# Patient Record
Sex: Male | Born: 1970 | Race: White | Hispanic: No | Smoking: Never smoker
Health system: Southern US, Community
[De-identification: ages and names within clinical notes are randomized; demographics above are authoritative.]

## PROBLEM LIST (undated history)

## (undated) DIAGNOSIS — R42 Dizziness and giddiness: Secondary | ICD-10-CM

## (undated) HISTORY — DX: Dizziness and giddiness: R42

## (undated) HISTORY — PX: UMBILICAL HERNIA REPAIR: SHX196

---

## 2002-03-10 ENCOUNTER — Encounter: Payer: Self-pay | Admitting: Family Medicine

## 2002-03-10 ENCOUNTER — Ambulatory Visit (HOSPITAL_COMMUNITY): Admission: RE | Admit: 2002-03-10 | Discharge: 2002-03-10 | Payer: Self-pay | Admitting: Family Medicine

## 2002-05-11 ENCOUNTER — Emergency Department (HOSPITAL_COMMUNITY): Admission: EM | Admit: 2002-05-11 | Discharge: 2002-05-11 | Payer: Self-pay | Admitting: Emergency Medicine

## 2002-05-11 ENCOUNTER — Encounter: Payer: Self-pay | Admitting: Emergency Medicine

## 2003-02-07 ENCOUNTER — Encounter: Payer: Self-pay | Admitting: Orthopedic Surgery

## 2003-02-07 ENCOUNTER — Encounter: Admission: RE | Admit: 2003-02-07 | Discharge: 2003-02-07 | Payer: Self-pay | Admitting: Gastroenterology

## 2004-11-20 ENCOUNTER — Emergency Department (HOSPITAL_COMMUNITY): Admission: EM | Admit: 2004-11-20 | Discharge: 2004-11-20 | Payer: Self-pay | Admitting: Emergency Medicine

## 2013-08-15 ENCOUNTER — Ambulatory Visit
Admission: RE | Admit: 2013-08-15 | Discharge: 2013-08-15 | Disposition: A | Discharge status: Home / Self Care | Payer: BC Managed Care – PPO | Source: Ambulatory Visit | Attending: Physician Assistant | Admitting: Physician Assistant

## 2013-08-15 ENCOUNTER — Other Ambulatory Visit: Payer: Self-pay | Admitting: Physician Assistant

## 2013-08-15 DIAGNOSIS — R079 Chest pain, unspecified: Secondary | ICD-10-CM

## 2014-01-01 ENCOUNTER — Other Ambulatory Visit: Payer: Self-pay | Admitting: Family Medicine

## 2014-01-01 ENCOUNTER — Ambulatory Visit
Admission: RE | Admit: 2014-01-01 | Discharge: 2014-01-01 | Disposition: A | Discharge status: Home / Self Care | Payer: BC Managed Care – PPO | Source: Ambulatory Visit | Attending: Family Medicine | Admitting: Family Medicine

## 2014-01-01 DIAGNOSIS — R52 Pain, unspecified: Secondary | ICD-10-CM

## 2014-01-16 ENCOUNTER — Other Ambulatory Visit: Payer: Self-pay | Admitting: Physician Assistant

## 2014-01-16 DIAGNOSIS — R51 Headache: Secondary | ICD-10-CM

## 2014-01-16 DIAGNOSIS — R42 Dizziness and giddiness: Secondary | ICD-10-CM

## 2014-01-20 ENCOUNTER — Ambulatory Visit
Admission: RE | Admit: 2014-01-20 | Discharge: 2014-01-20 | Disposition: A | Discharge status: Home / Self Care | Payer: BC Managed Care – PPO | Source: Ambulatory Visit | Attending: Physician Assistant | Admitting: Physician Assistant

## 2014-01-20 DIAGNOSIS — R51 Headache: Secondary | ICD-10-CM

## 2014-01-20 DIAGNOSIS — R42 Dizziness and giddiness: Secondary | ICD-10-CM

## 2014-02-21 ENCOUNTER — Encounter: Payer: Self-pay | Admitting: Neurology

## 2014-02-21 ENCOUNTER — Ambulatory Visit (INDEPENDENT_AMBULATORY_CARE_PROVIDER_SITE_OTHER): Payer: BC Managed Care – PPO | Admitting: Neurology

## 2014-02-21 VITALS — BP 130/68 | HR 78 | Temp 98.7°F | Resp 18 | Ht 68.0 in | Wt 144.6 lb

## 2014-02-21 DIAGNOSIS — M542 Cervicalgia: Secondary | ICD-10-CM

## 2014-02-21 DIAGNOSIS — H9311 Tinnitus, right ear: Secondary | ICD-10-CM

## 2014-02-21 DIAGNOSIS — R209 Unspecified disturbances of skin sensation: Secondary | ICD-10-CM

## 2014-02-21 DIAGNOSIS — R200 Anesthesia of skin: Secondary | ICD-10-CM

## 2014-02-21 DIAGNOSIS — H9319 Tinnitus, unspecified ear: Secondary | ICD-10-CM

## 2014-02-21 DIAGNOSIS — R42 Dizziness and giddiness: Secondary | ICD-10-CM

## 2014-02-21 NOTE — Progress Notes (Signed)
NEUROLOGY CONSULTATION NOTE  Jeremiah Bailey MRN: [id number] DOB: 10-Jul-1970  Referring provider: Dr. Blair Heys Primary care provider: Dr. Blair Heys  Reason for consult:  Recurrent episodes of dizziness, off-balance  Dear Dr Manus Gunning:  Thank you for your kind referral of Jeremiah Bailey for consultation of the above symptoms. Although his history is well known to you, please allow me to reiterate it for the purpose of our medical record. Records and images were personally reviewed where available.  HISTORY OF PRESENT ILLNESS: This is a pleasant 43 year old right-handed man with no significant past medical history, in his usual state of health until the first week of July while at work when he had sudden onset dizziness described as lightheadedness and feeling "swimmy-headed," off-balance, feeling like he would pass out.  The symptoms eased off in 5-10 minutes, however he continued to feel "off and swimmy-headed" and went home.  Symptoms resolved that evening.  There was no associated headache but he did feel a pressure in his head.  No nausea, vomiting, focal numbness/tingling/weakness, or confusion.  He went to his PCP and had reported neck pain radiating up his head. A cervical xray done had shown degenerative disc disease at C4-5, C5-6, and C6-7 levels with loss of disc space, sclerosis, and spurring.  He was given Etodolac prn.  Around 2 weeks later, he had the same symptoms at work with sudden onset lightheadedness and sensation that he would pass out.  Since then, he has had a constant sensation of imbalance, and would have recurrent brief episodes of feeling lightheaded of varying intensity, lasting 5-10 minutes, occurring a couple of times a week.  It can occur while standing or sitting, one time it occurred while lying down. He cannot pinpoint any specific triggers. Over the past 1-2 months, he has had an increase in headaches over the frontal regions, occurring 1-2 times a week  with a dull aching pressure in the afternoon, usually resolved when he wakes up the next morning. There is no associated nausea/vomiting, he has been more photosensitive. He takes prn Advil. He occasionally hears his heart beat on the right temporal region and endorses occasional bilateral tinnitus. He feels he had hearing loss and saw an ENT specialist 2 weeks ago but was told hearing test only showed mild hearing loss with no other problems.  He occasionally feels a pressure around his ears. He has occasional sensation of electricity in the right frontal region radiating into his right eye.  He continues to have neck pain and cannot find a comfortable position. He has tightness radiating down the shoulder blades when he flexes his head down. He has occasional tingling in his hands and fingers lasting 30-60 minutes 1-2 times a week.  He has noted weakness in his legs, described as muscle fatigue as if he had jogged a long distance.  He has a prescription for Xanax due to anxiety for the past few weeks due to lack of work and his current symptoms.  He denies any episodes of staring/unresponsiveness, gaps in time, olfactory/gustatory hallucinations, deja vu, rising epigastric sensation, focal numbness/tingling/weakness, myoclonic jerks. His mother had bouts of dizziness of unclear etiology per patient. He denies any recent head injuries. He was thrown off a horse in the late 1990s with brief loss of consciousness. Otherwise he had a normal birth and early development.  There is no history of febrile convulsions, CNS infections such as meningitis/encephalitis, neurosurgical procedures, or family history of seizures.  I personally  reviewed head CT without contrast which was normal.   Most recent bloodwork available from 03/2013 showed normal CBC, CMP, TSH, lipid panel.  PAST MEDICAL HISTORY: Past Medical History  Diagnosis Date  . Dizziness and giddiness     PAST SURGICAL HISTORY: Past Surgical History    Procedure Laterality Date  . Umbilical hernia repair      MEDICATIONS: No current outpatient prescriptions on file prior to visit.   No current facility-administered medications on file prior to visit.    ALLERGIES: Allergies  Allergen Reactions  . Prednisone   . Septra [Sulfamethoxazole-Trimethoprim]     FAMILY HISTORY: Family History  Problem Relation Age of Onset  . Hypertension Father   . Cancer Maternal Grandmother     ovarian   . Hypertension Maternal Grandfather   . Stroke Maternal Grandfather   . Heart failure Paternal Grandmother   . Cancer Paternal Grandfather     unknown    SOCIAL HISTORY: History   Social History  . Marital Status: Divorced    Spouse Name: N/A    Number of Children: N/A  . Years of Education: N/A   Occupational History  . Not on file.   Social History Main Topics  . Smoking status: Never Smoker   . Smokeless tobacco: Not on file  . Alcohol Use: Yes  . Drug Use: No  . Sexual Activity: Yes    Partners: Female    Pharmacist, hospital Protection: None   Other Topics Concern  . Not on file   Social History Narrative  . No narrative on file    REVIEW OF SYSTEMS: Constitutional: No fevers, chills, or sweats, no generalized fatigue, change in appetite Eyes: No visual changes, double vision, eye pain Ear, nose and throat: + hearing loss, no ear pain, nasal congestion, sore throat Cardiovascular: No chest pain, palpitations Respiratory:  No shortness of breath at rest or with exertion, wheezes GastrointestinaI: No nausea, vomiting, diarrhea, abdominal pain, fecal incontinence Genitourinary:  No dysuria, urinary retention or frequency Musculoskeletal:  + neck pain, back pain Integumentary: No rash, pruritus, skin lesions Neurological: as above Psychiatric: No depression, insomnia, +anxiety Endocrine: No palpitations, fatigue, diaphoresis, mood swings, change in appetite, change in weight, increased thirst Hematologic/Lymphatic:  No  anemia, purpura, petechiae. Allergic/Immunologic: no itchy/runny eyes, nasal congestion, recent allergic reactions, rashes  PHYSICAL EXAM: Filed Vitals:   02/21/14 1317  BP: 130/68  Pulse: 78  Temp: 98.7 F (37.1 C)  Resp: 18   General: No acute distress Head:  Normocephalic/atraumatic Eyes: Fundoscopic exam shows bilateral sharp discs, no vessel changes, exudates, or hemorrhages Neck: supple, no paraspinal tenderness, full range of motion Back: No paraspinal tenderness Heart: regular rate and rhythm Lungs: Clear to auscultation bilaterally. Vascular: No carotid bruits. Skin/Extremities: No rash, no edema Neurological Exam: Mental status: alert and oriented to person, place, and time, no dysarthria or aphasia, Fund of knowledge is appropriate.  Recent and remote memory are intact.  Attention and concentration are normal.    Able to name objects and repeat phrases. Cranial nerves: CN I: not tested CN II: pupils equal, round and reactive to light, visual fields intact, fundi unremarkable. CN III, IV, VI:  full range of motion, no nystagmus, no ptosis CN V: decreased cold on left V2-3, intact to pin. Split midline with tuning fork and pin to right side CN VII: upper and lower face symmetric CN VIII: hearing intact to finger rub CN IX, X: gag intact, uvula midline CN XI: sternocleidomastoid and trapezius muscles  intact CN XII: tongue midline Bulk & Tone: normal, no fasciculations. Motor: 5/5 throughout with no pronator drift. Sensation: intact to light touch, cold, pin, vibration and joint position sense.  No extinction to double simultaneous stimulation.  Romberg test negative Deep Tendon Reflexes: +2 throughout, no ankle clonus Plantar responses: downgoing bilaterally Cerebellar: no incoordination on finger to nose, heel to shin. No dysdiadochokinesia Gait: narrow-based and steady, able to tandem walk adequately. Tremor: none  IMPRESSION: This is a pleasant 43 year old  right-handed man with no significant past medical history presenting for new onset recurrent episodes of dizziness of varying intensity, lasting a few minutes, but with sensation of feeling off-balance and increased headaches, as well as pulsing sensation on the right side of his head.  His neurological exam shows subjective decreased sensation on the right side of his face, otherwise non-focal. The etiology of his symptoms is unclear, differential diagnosis is broad, including headache-related dizziness, cervicogenic dizziness, seizures, demyelinating disease.  MRI brain with and without contrast, MRI C-spine without contrast, and routine EEG will be ordered to further evaluate his symptoms.  [place] driving laws were discussed with the patient, and he knows to stop driving after an episode of loss of consciousness, until 6 months event-free. He will follow-up after the tests.  Thank you for allowing me to participate in the care of this patient. Please do not hesitate to call for any questions or concerns.   Patrcia Dolly, M.D.  CC: Dr. Manus Gunning

## 2014-02-21 NOTE — Patient Instructions (Addendum)
1. MRI brain with and without contrast 2. MRI cervical spine without contrast 3. Routine EEG 4. Follow-up after tests

## 2014-02-22 ENCOUNTER — Encounter: Payer: Self-pay | Admitting: Neurology

## 2014-02-22 DIAGNOSIS — M542 Cervicalgia: Secondary | ICD-10-CM | POA: Insufficient documentation

## 2014-02-22 DIAGNOSIS — H93A9 Pulsatile tinnitus, unspecified ear: Secondary | ICD-10-CM | POA: Insufficient documentation

## 2014-02-22 DIAGNOSIS — R42 Dizziness and giddiness: Secondary | ICD-10-CM | POA: Insufficient documentation

## 2014-02-22 DIAGNOSIS — R200 Anesthesia of skin: Secondary | ICD-10-CM | POA: Insufficient documentation

## 2014-03-04 ENCOUNTER — Ambulatory Visit (INDEPENDENT_AMBULATORY_CARE_PROVIDER_SITE_OTHER): Payer: BC Managed Care – PPO | Admitting: Neurology

## 2014-03-04 DIAGNOSIS — R200 Anesthesia of skin: Secondary | ICD-10-CM

## 2014-03-04 DIAGNOSIS — R42 Dizziness and giddiness: Secondary | ICD-10-CM

## 2014-03-05 ENCOUNTER — Other Ambulatory Visit: Payer: BC Managed Care – PPO

## 2014-03-05 ENCOUNTER — Ambulatory Visit (HOSPITAL_COMMUNITY): Admission: RE | Admit: 2014-03-05 | Payer: BC Managed Care – PPO | Source: Ambulatory Visit

## 2014-03-05 ENCOUNTER — Ambulatory Visit (HOSPITAL_COMMUNITY)
Admission: RE | Admit: 2014-03-05 | Discharge: 2014-03-05 | Disposition: A | Discharge status: Home / Self Care | Payer: BC Managed Care – PPO | Source: Ambulatory Visit | Attending: Neurology | Admitting: Neurology

## 2014-03-05 DIAGNOSIS — J320 Chronic maxillary sinusitis: Secondary | ICD-10-CM | POA: Insufficient documentation

## 2014-03-05 DIAGNOSIS — M48061 Spinal stenosis, lumbar region without neurogenic claudication: Secondary | ICD-10-CM | POA: Insufficient documentation

## 2014-03-05 DIAGNOSIS — R51 Headache: Secondary | ICD-10-CM | POA: Insufficient documentation

## 2014-03-05 DIAGNOSIS — R42 Dizziness and giddiness: Secondary | ICD-10-CM | POA: Diagnosis not present

## 2014-03-05 DIAGNOSIS — M542 Cervicalgia: Secondary | ICD-10-CM

## 2014-03-05 DIAGNOSIS — M47812 Spondylosis without myelopathy or radiculopathy, cervical region: Secondary | ICD-10-CM | POA: Insufficient documentation

## 2014-03-05 MED ORDER — GADOBENATE DIMEGLUMINE 529 MG/ML IV SOLN
15.0000 mL | Freq: Once | INTRAVENOUS | Status: AC | PRN
  Administered 2014-03-05: 13 mL via INTRAVENOUS

## 2014-03-07 ENCOUNTER — Other Ambulatory Visit: Payer: Self-pay | Admitting: Family Medicine

## 2014-03-07 DIAGNOSIS — M542 Cervicalgia: Secondary | ICD-10-CM

## 2014-03-07 DIAGNOSIS — R42 Dizziness and giddiness: Secondary | ICD-10-CM

## 2014-03-10 NOTE — Procedures (Signed)
ELECTROENCEPHALOGRAM REPORT  Date of Study: 03/04/2014  Patient's Name: Jeremiah Bailey MRN: [id number] Date of Birth: 03-Mar-1971  Referring Provider: Dr. Patrcia Dolly  Clinical History: This is a 43 year old man with new onset recurrent episodes of dizziness of varying intensity, lasting a few minutes, but with sensation of feeling off-balance and increased headaches.  Medications: none  Technical Summary: A multichannel digital EEG recording measured by the international 10-20 system with electrodes applied with paste and impedances below 5000 ohms performed in our laboratory with EKG monitoring in an awake and asleep patient.  Hyperventilation and photic stimulation were performed.  The digital EEG was referentially recorded, reformatted, and digitally filtered in a variety of bipolar and referential montages for optimal display.    Description: The patient is awake and asleep during the recording.  During maximal wakefulness, there is a symmetric, medium voltage 10.5 Hz posterior dominant rhythm that attenuates with eye opening.  The record is symmetric.  During drowsiness and sleep, there is an increase in theta slowing of the background.  Vertex waves and symmetric sleep spindles were seen.  Hyperventilation and photic stimulation did not elicit any abnormalities.  There were no epileptiform discharges or electrographic seizures seen.    EKG lead was unremarkable.  Impression: This awake and asleep EEG is normal.    Clinical Correlation: A normal EEG does not exclude a clinical diagnosis of epilepsy.  If further clinical questions remain, prolonged EEG may be helpful.  Clinical correlation is advised.   Patrcia Dolly, M.D.

## 2014-03-17 ENCOUNTER — Other Ambulatory Visit: Payer: Self-pay | Admitting: Physician Assistant

## 2014-03-17 DIAGNOSIS — E049 Nontoxic goiter, unspecified: Secondary | ICD-10-CM

## 2014-03-20 ENCOUNTER — Ambulatory Visit
Admission: RE | Admit: 2014-03-20 | Discharge: 2014-03-20 | Disposition: A | Discharge status: Home / Self Care | Payer: BC Managed Care – PPO | Source: Ambulatory Visit | Attending: Physician Assistant | Admitting: Physician Assistant

## 2014-03-20 DIAGNOSIS — E049 Nontoxic goiter, unspecified: Secondary | ICD-10-CM

## 2014-03-24 ENCOUNTER — Ambulatory Visit (INDEPENDENT_AMBULATORY_CARE_PROVIDER_SITE_OTHER): Payer: BC Managed Care – PPO | Admitting: Neurology

## 2014-03-24 ENCOUNTER — Encounter: Payer: Self-pay | Admitting: Neurology

## 2014-03-24 VITALS — BP 112/76 | HR 83 | Resp 16 | Ht 68.0 in | Wt 145.0 lb

## 2014-03-24 DIAGNOSIS — R42 Dizziness and giddiness: Secondary | ICD-10-CM

## 2014-03-24 DIAGNOSIS — M542 Cervicalgia: Secondary | ICD-10-CM

## 2014-03-24 NOTE — Patient Instructions (Signed)
1. Proceed with Physical therapy as scheduled 2. Start Flexeril  at bedtime for neck pain, muscle spasm 3. Continue all your other medications 4. Follow-up in 6-8 weeks

## 2014-03-24 NOTE — Progress Notes (Signed)
NEUROLOGY FOLLOW UP OFFICE NOTE  ALIN CHAVIRA [id number]  HISTORY OF PRESENT ILLNESS: I had the pleasure of seeing Jeremiah Bailey in follow-up in the neurology clinic on 03/24/2014.  The patient was last seen a month ago for episodes of dizziness and headaches. Records and images were personally reviewed where available.  I personally reviewed MRI brain with and without contrast and MRI cervical spine which did not show any acute intracranial abnormalities. 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  He had been referred for PT and has not seen them yet.  Symptoms likely to cervicogenic headaches and dizziness, in addition to anxiety.  He reports the off balance sensation still comes every now and then, he sits down and symptoms resolve. However he had one bigger episode 2 weekends ago while having a bbq, he started feeling sweaty and hot, lightheaded and wobbly, he felt he was losing his balance and sat in the A/C for 1-1/2 hours, slowly feeling better. No loss of consciousness.  He has been started on clonazepam by his PCP, morning dose causes some daytime drowsiness.  HPI:  This is a pleasant 43 yo RH man with no significant past medical history, in his usual state of health until the first week of July while at work when he had sudden onset dizziness described as lightheadedness and feeling "swimmy-headed," off-balance, feeling like he would pass out. The symptoms eased off in 5-10 minutes, however he continued to feel "off and swimmy-headed" and went home. Symptoms resolved that evening. There was no associated headache but he did feel a pressure in his head. No nausea, vomiting, focal numbness/tingling/weakness, or confusion. He went to his PCP and had reported neck pain radiating up his head. A cervical xray done had shown degenerative disc  disease at C4-5, C5-6, and C6-7 levels with loss of disc space, sclerosis, and spurring. He was given Etodolac prn. Around 2 weeks later, he had the same symptoms at work with sudden onset lightheadedness and sensation that he would pass out. Since then, he has had a constant sensation of imbalance, and would have recurrent brief episodes of feeling lightheaded of varying intensity, lasting 5-10 minutes, occurring a couple of times a week. It can occur while standing or sitting, one time it occurred while lying down. He cannot pinpoint any specific triggers. Over the past 1-2 months, he has had an increase in headaches over the frontal regions, occurring 1-2 times a week with a dull aching pressure in the afternoon, usually resolved when he wakes up the next morning. There is no associated nausea/vomiting, he has been more photosensitive. He takes prn Advil. He occasionally hears his heart beat on the right temporal region and endorses occasional bilateral tinnitus. He feels he had hearing loss and saw an ENT specialist 2 weeks ago but was told hearing test only showed mild hearing loss with no other problems.   He continues to have neck pain and cannot find a comfortable position. He has tightness radiating down the shoulder blades when he flexes his head down. He has occasional tingling in his hands and fingers lasting 30-60 minutes 1-2 times a week. He has noted weakness in his legs, described as muscle fatigue as if he had jogged a long distance. He has a prescription for Xanax due to anxiety for the past few weeks due to lack of work and  his current symptoms.   Most recent bloodwork available from 03/2013 showed normal CBC, CMP, TSH, lipid panel.   PAST MEDICAL HISTORY: Past Medical History  Diagnosis Date  . Dizziness and giddiness     MEDICATIONS: Current Outpatient Prescriptions on File Prior to Visit  Medication Sig Dispense Refill  . fluticasone (FLONASE) 50 MCG/ACT nasal spray Place 1  spray into both nostrils daily.      Marland Kitchen omeprazole (PRILOSEC) 40 MG capsule Take 40 mg by mouth daily.       No current facility-administered medications on file prior to visit.    ALLERGIES: Allergies  Allergen Reactions  . Prednisone   . Septra [Sulfamethoxazole-Trimethoprim]     FAMILY HISTORY: Family History  Problem Relation Age of Onset  . Hypertension Father   . Cancer Maternal Grandmother     ovarian   . Hypertension Maternal Grandfather   . Stroke Maternal Grandfather   . Heart failure Paternal Grandmother   . Cancer Paternal Grandfather     unknown    SOCIAL HISTORY: History   Social History  . Marital Status: Significant Other    Spouse Name: N/A    Number of Children: N/A  . Years of Education: N/A   Occupational History  . Not on file.   Social History Main Topics  . Smoking status: Never Smoker   . Smokeless tobacco: Not on file  . Alcohol Use: Yes  . Drug Use: No  . Sexual Activity: Yes    Partners: Female    Pharmacist, hospital Protection: None   Other Topics Concern  . Not on file   Social History Narrative  . No narrative on file    REVIEW OF SYSTEMS: Constitutional: No fevers, chills, or sweats, no generalized fatigue, change in appetite Eyes: No visual changes, double vision, eye pain Ear, nose and throat: No hearing loss, ear pain, nasal congestion, sore throat Cardiovascular: No chest pain, palpitations Respiratory:  No shortness of breath at rest or with exertion, wheezes GastrointestinaI: No nausea, vomiting, diarrhea, abdominal pain, fecal incontinence Genitourinary:  No dysuria, urinary retention or frequency Musculoskeletal:  + neck pain, back pain Integumentary: No rash, pruritus, skin lesions Neurological: as above Psychiatric: No depression, insomnia, anxiety Endocrine: No palpitations, fatigue, diaphoresis, mood swings, change in appetite, change in weight, increased thirst Hematologic/Lymphatic:  No anemia, purpura,  petechiae. Allergic/Immunologic: no itchy/runny eyes, nasal congestion, recent allergic reactions, rashes  PHYSICAL EXAM: Filed Vitals:   03/24/14 1124  BP: 112/76  Pulse: 83  Resp: 16   General: No acute distress Head:  Normocephalic/atraumatic Neck: supple, no paraspinal tenderness, full range of motion Heart:  Regular rate and rhythm Lungs:  Clear to auscultation bilaterally Back: No paraspinal tenderness Skin/Extremities: No rash, no edema Neurological Exam: alert and oriented to person, place, and time. No aphasia or dysarthria. Fund of knowledge is appropriate.  Recent and remote memory are intact.  Attention and concentration are normal.    Able to name objects and repeat phrases. Cranial nerves: Pupils equal, round, reactive to light.  Fundoscopic exam unremarkable, no papilledema. Extraocular movements intact with no nystagmus. Visual fields full. Facial sensation intact. No facial asymmetry. Tongue, uvula, palate midline.  Motor: Bulk and tone normal, muscle strength 5/5 throughout with no pronator drift.  Sensation to light touch.  No extinction to double simultaneous stimulation.  Deep tendon reflexes 2+ throughout, toes downgoing.  Finger to nose testing intact.  Gait narrow-based and steady, able to tandem walk adequately.  Romberg negative.  IMPRESSION:  This is a pleasant 43 yo RH man with no significant past medical history who presented with new onset recurrent episodes of dizziness of varying intensity, lasting a few minutes, but with sensation of feeling off-balance and increased headaches, as well as pulsing sensation on the right side of his head. We discussed MRI brain with no acute abnormalities, no stenosis of intracranial vasculature with contrast sequences. MRI C-spine does show some degenerative changes, symptoms likely due to cervicogenic dizziness and headaches.  He will be referred for PT. We discussed magnification of symptoms with anxiety, continue treatment with  his PCP.  He will be given a muscle relaxant to take at bedtime. If symptoms continue despite treatment of neck pain and anxiety, consideration for cardiology evaluation for episodic dizziness with diaphoresis.  He will follow-up in 2 months  Thank you for allowing me to participate in his care.  Please do not hesitate to call for any questions or concerns.  The duration of this appointment visit was 15 minutes of face-to-face time with the patient.  Greater than 50% of this time was spent in counseling, explanation of diagnosis, planning of further management, and coordination of care.   Patrcia Dolly, M.D.   CC: Dr. Manus Gunning

## 2014-03-28 ENCOUNTER — Ambulatory Visit: Payer: BC Managed Care – PPO | Attending: Neurology | Admitting: Physical Therapy

## 2014-03-28 DIAGNOSIS — M542 Cervicalgia: Secondary | ICD-10-CM | POA: Insufficient documentation

## 2014-03-28 DIAGNOSIS — Z5189 Encounter for other specified aftercare: Secondary | ICD-10-CM | POA: Insufficient documentation

## 2014-03-28 DIAGNOSIS — R42 Dizziness and giddiness: Secondary | ICD-10-CM | POA: Insufficient documentation

## 2014-04-11 ENCOUNTER — Ambulatory Visit: Payer: BC Managed Care – PPO | Admitting: Physical Therapy

## 2014-04-11 DIAGNOSIS — Z5189 Encounter for other specified aftercare: Secondary | ICD-10-CM | POA: Diagnosis not present

## 2014-04-25 ENCOUNTER — Ambulatory Visit: Payer: BC Managed Care – PPO | Admitting: Physical Therapy

## 2014-04-25 DIAGNOSIS — Z5189 Encounter for other specified aftercare: Secondary | ICD-10-CM | POA: Diagnosis not present

## 2014-05-02 ENCOUNTER — Ambulatory Visit: Payer: BC Managed Care – PPO | Attending: Neurology | Admitting: Physical Therapy

## 2014-05-02 DIAGNOSIS — Z5189 Encounter for other specified aftercare: Secondary | ICD-10-CM | POA: Diagnosis not present

## 2014-05-02 DIAGNOSIS — M542 Cervicalgia: Secondary | ICD-10-CM | POA: Diagnosis not present

## 2014-05-02 DIAGNOSIS — R42 Dizziness and giddiness: Secondary | ICD-10-CM

## 2014-05-02 NOTE — Therapy (Signed)
Physical Therapy Treatment  Patient Details  Name: Jeremiah MoselleWilliam G Stemm MRN: [id number] Date of Birth: [birth year]  Encounter Date: 05/02/2014      PT End of Session - 05/02/14 0855    Visit Number 4   Number of Visits 12   Date for PT Re-Evaluation 06/07/14   PT Start Time 0801   PT Stop Time 0857   PT Time Calculation (min) 56 min   Activity Tolerance Patient tolerated treatment well      Past Medical History  Diagnosis Date  . Dizziness and giddiness     Past Surgical History  Procedure Laterality Date  . Umbilical hernia repair      There were no vitals taken for this visit.  Visit Diagnosis:  Neck pain  Dizziness and giddiness        [facility] PT Assessment - 05/02/14 0804    AROM   Cervical Flexion 60   Cervical Extension 57   Cervical - Right Side Bend 44   Cervical - Left Side Bend 50   Cervical - Right Rotation 55   Cervical - Left Rotation 40          [facility] Adult PT Treatment/Exercise - 05/02/14 0847    Exercises   Exercises Neck   Neck Exercises: Seated   Cervical Isometrics Limitations --  Review previous isometric HEP   Cervical Rotation --  Left cervical rotation 10 reps   Neck Exercises: Supine   Neck Retraction --  10 reps   Capital Flexion --  10 reps   Neck Exercises: Prone   Other Prone Exercise --  quadruped UE/LE/alternating 8 reps each   Moist Heat Therapy   Number Minutes Moist Heat --  10 minutes supine   Manual Therapy   Manual Therapy --  Dry needle to cervical multifidi,splenius and soft tissuemob   Manual Therapy   Joint Mobilization --  C4-7 P-A mobs and rotational mobs              PT Long Term Goals - 05/02/14 0905    PT LONG TERM GOAL #1   Title Independent with HEP to address dizziness and neck pain.     Time 4   Period Weeks   Status On-going   PT LONG TERM GOAL #2   Title Improved cervical ROM by 5 degrees all planes for improved ROM.     Time 4   Period Weeks   Status Achieved   PT LONG TERM GOAL  #3   Title Report pain decrease to <4/10 at worst   Time 4   Period Weeks   Status On-going          Plan - 05/02/14 0859    Clinical Impression Statement The patient has marked improvement in cervical A/ROM in all planes.  Most limited direction is left rotation.  He reports decreasing neck pain overall and no dizziness.  Improving toward LTGs and should meet in the next 1-2 visits.     PT Plan Assess remaining goals and may discharge to independent HEP next visit.          Problem List Patient Active Problem List   Diagnosis Date Noted  . Dizziness 02/22/2014  . Numbness 02/22/2014  . Neck pain 02/22/2014  . Pulsatile tinnitus 02/22/2014  Lavinia SharpsSimpson, Stacy C 05/02/2014, 9:09 AM

## 2014-05-16 ENCOUNTER — Ambulatory Visit: Payer: BC Managed Care – PPO | Admitting: Physical Therapy

## 2014-05-16 DIAGNOSIS — Z5189 Encounter for other specified aftercare: Secondary | ICD-10-CM | POA: Diagnosis not present

## 2014-05-16 DIAGNOSIS — M542 Cervicalgia: Secondary | ICD-10-CM

## 2014-05-16 NOTE — Therapy (Signed)
Physical Therapy Treatment  Patient Details  Name: Jeremiah Bailey MRN: [id number] Date of Birth: 1971/06/07  Encounter Date: 05/16/2014      PT End of Session - 05/16/14 0855    Visit Number 5   Number of Visits 12   Date for PT Re-Evaluation 06/07/14   PT Start Time 0755   PT Stop Time 0845   PT Time Calculation (min) 50 min   Activity Tolerance --  Patient without pain today.  Decreased tightness following treatment.      Past Medical History  Diagnosis Date  . Dizziness and giddiness     Past Surgical History  Procedure Laterality Date  . Umbilical hernia repair      There were no vitals taken for this visit.  Visit Diagnosis:  Neck pain      Subjective Assessment - 05/16/14 0848    Symptoms No pain at present just a "tightness" sensation lower neck C5-C7 region.            [facility] PT Assessment - 05/16/14 0851    AROM   Cervical Flexion 63   Cervical Extension 58   Cervical - Right Side Bend 50   Cervical - Left Side Bend 50   Cervical - Right Rotation 48   Cervical - Left Rotation 48   Strength   Cervical Flexion 5/5   Cervical Extension 5/5          [facility] Adult PT Treatment/Exercise - 05/16/14 0852    Exercises   Exercises Neck   Neck Exercises: Stretches   Upper Trapezius Stretch --  Review of previous HEP and discussion of safe self progress   Manual Therapy   Manual Therapy --  Soft tissue mob following dry needle to cervical multifidi   Manual Therapy --  Supine cervical distraction 3x 45 sec          PT Education - 05/16/14 0854    Education provided Yes   Education Details HEP review, areas of focus   Person(s) Educated Patient   Methods Explanation;Demonstration   Comprehension Verbalized understanding;Returned demonstration            PT Long Term Goals - 05/16/14 0947    PT LONG TERM GOAL #1   Title Independent with HEP to address dizziness and neck pain.     Status Achieved   PT LONG TERM GOAL #2   Title  Improved cervical ROM by 5 degrees all planes for improved ROM.     Status Achieved   PT LONG TERM GOAL #3   Title Report pain decrease to <4/10 at worst   Status Partially Met          Plan - 05/16/14 0857    Clinical Impression Statement The patient made good progress with PT inteventions including dry needling, ROM, postural strengthening and  postural education.  He denies dizziness.  He states that his neck pain is less intense and present infrequently.  He states the pain is 5/10 at worst and goes away quickly.  His cervical ROM is symmetrical and WFLs.  He has been instructed in a comprehensive HEP and is independent in safe, self progession.  The majority of LTGs have been met and therefore recommend discharge from PT at this time.     PT Plan Discharge from PT to independent HEP with majority of goals met.        Problem List Patient Active Problem List   Diagnosis Date Noted  . Dizziness 02/22/2014  .  Numbness 02/22/2014  . Neck pain 02/22/2014  . Pulsatile tinnitus 02/22/2014                    Trigger Point Dry Needling - 05/16/14 0849    Consent Given? Yes   Education Handout Provided --  Previously given   Muscles Treated Upper Body --  Bilateral cervical multifidi 38m, bilateral levator scapula                                  Jeremiah Singh11/20/2015, 9:10 AM     PHYSICAL THERAPY DISCHARGE SUMMARY  Visits from Start of Care: 5  Current functional level related to goals / functional outcomes: See clinical impression above.  Majority of LTGs met.  Patient expresses readiness for discharge to independent self management.   Remaining deficits: See above   Education / Equipment: HEP Plan: Patient agrees to discharge.  Patient goals were met. Patient is being discharged due to meeting the stated rehab goals.  ?????  SRuben Im PT 05/16/2014 9:11 AM Phone: [phone]: [phone]

## 2014-05-20 ENCOUNTER — Encounter: Payer: Self-pay | Admitting: *Deleted

## 2014-05-26 ENCOUNTER — Ambulatory Visit (INDEPENDENT_AMBULATORY_CARE_PROVIDER_SITE_OTHER): Payer: BC Managed Care – PPO | Admitting: Neurology

## 2014-05-26 ENCOUNTER — Encounter: Payer: Self-pay | Admitting: Neurology

## 2014-05-26 VITALS — BP 110/78 | HR 72 | Resp 16 | Ht 68.0 in | Wt 149.0 lb

## 2014-05-26 DIAGNOSIS — R51 Headache: Secondary | ICD-10-CM

## 2014-05-26 DIAGNOSIS — M542 Cervicalgia: Secondary | ICD-10-CM

## 2014-05-26 DIAGNOSIS — R42 Dizziness and giddiness: Secondary | ICD-10-CM

## 2014-05-26 DIAGNOSIS — G4486 Cervicogenic headache: Secondary | ICD-10-CM

## 2014-05-26 NOTE — Progress Notes (Signed)
NEUROLOGY FOLLOW UP OFFICE NOTE  Vassie MoselleWilliam G Vanwyhe [id number]  HISTORY OF PRESENT ILLNESS: I had the pleasure of seeing Aris EvertsWilliam Birman in follow-up in the neurology clinic on 05/26/2014.  The patient was last seen 2 months ago for episodes of dizziness and headaches, neck pain. MRI brain normal, MRI C-spine showed degenerative changes. Symptoms felt to be due to cervicogenic dizziness and cervicogenic headaches, he underwent physical therapy and reports feeling much better, he denies any further dizziness or headaches. He denies any focal numbness/tingling/weakness.   HPI: This is a pleasant 43 yo RH man with no significant past medical history, in his usual state of health until the first week of July while at work when he had sudden onset dizziness described as lightheadedness and feeling "swimmy-headed," off-balance, feeling like he would pass out. The symptoms eased off in 5-10 minutes, however he continued to feel "off and swimmy-headed" and went home. Symptoms resolved that evening. There was no associated headache but he did feel a pressure in his head. No nausea, vomiting, focal numbness/tingling/weakness, or confusion. He went to his PCP and had reported neck pain radiating up his head. A cervical xray done had shown degenerative disc disease at C4-5, C5-6, and C6-7 levels with loss of disc space, sclerosis, and spurring. He was given Etodolac prn. Around 2 weeks later, he had the same symptoms at work with sudden onset lightheadedness and sensation that he would pass out. Since then, he has had a constant sensation of imbalance, and would have recurrent brief episodes of feeling lightheaded of varying intensity, lasting 5-10 minutes, occurring a couple of times a week. It can occur while standing or sitting, one time it occurred while lying down. He cannot pinpoint any specific triggers. Over the past 1-2 months, he has had an increase in headaches over the frontal regions, occurring 1-2 times a  week with a dull aching pressure in the afternoon, usually resolved when he wakes up the next morning. There is no associated nausea/vomiting, he has been more photosensitive. He takes prn Advil. He occasionally hears his heart beat on the right temporal region and endorses occasional bilateral tinnitus. He feels he had hearing loss and saw an ENT specialist 2 weeks ago but was told hearing test only showed mild hearing loss with no other problems.   Diagnostic Data: MRI brain with and without contrast and MRI cervical spine which did not show any acute intracranial abnormalities. 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   PAST MEDICAL HISTORY: Past Medical History  Diagnosis Date  . Dizziness and giddiness     MEDICATIONS: Current Outpatient Prescriptions on File Prior to Visit  Medication Sig Dispense Refill  . clonazePAM (KLONOPIN) 0.5 MG tablet Take 0.5 mg by mouth 2 (two) times daily.    . fluticasone (FLONASE) 50 MCG/ACT nasal spray Place 1 spray into both nostrils daily.    Marland Kitchen. omeprazole (PRILOSEC) 40 MG capsule Take 40 mg by mouth daily.     No current facility-administered medications on file prior to visit.    ALLERGIES: Allergies  Allergen Reactions  . Prednisone   . Septra [Sulfamethoxazole-Trimethoprim]     FAMILY HISTORY: Family History  Problem Relation Age of Onset  . Hypertension Father   . Cancer Maternal Grandmother     ovarian   . Hypertension Maternal Grandfather   . Stroke Maternal Grandfather   .  Heart failure Paternal Grandmother   . Cancer Paternal Grandfather     unknown    SOCIAL HISTORY: History   Social History  . Marital Status: Significant Other    Spouse Name: N/A    Number of Children: N/A  . Years of Education: N/A   Occupational History  . Not on file.   Social History Main  Topics  . Smoking status: Never Smoker   . Smokeless tobacco: Not on file  . Alcohol Use: Yes  . Drug Use: No  . Sexual Activity:    Partners: Female    CopyBirth Control/ Protection: None   Other Topics Concern  . Not on file   Social History Narrative    REVIEW OF SYSTEMS: Constitutional: No fevers, chills, or sweats, no generalized fatigue, change in appetite Eyes: No visual changes, double vision, eye pain Ear, nose and throat: No hearing loss, ear pain, nasal congestion, sore throat Cardiovascular: No chest pain, palpitations Respiratory:  No shortness of breath at rest or with exertion, wheezes GastrointestinaI: No nausea, vomiting, diarrhea, abdominal pain, fecal incontinence Genitourinary:  No dysuria, urinary retention or frequency Musculoskeletal:  No neck pain, back pain Integumentary: No rash, pruritus, skin lesions Neurological: as above Psychiatric: No depression, insomnia, anxiety Endocrine: No palpitations, fatigue, diaphoresis, mood swings, change in appetite, change in weight, increased thirst Hematologic/Lymphatic:  No anemia, purpura, petechiae. Allergic/Immunologic: no itchy/runny eyes, nasal congestion, recent allergic reactions, rashes  PHYSICAL EXAM: Filed Vitals:   05/26/14 1523  BP: 110/78  Pulse: 72  Resp: 16   General: No acute distress Head:  Normocephalic/atraumatic Neck: supple, no paraspinal tenderness, full range of motion Heart:  Regular rate and rhythm Lungs:  Clear to auscultation bilaterally Back: No paraspinal tenderness Skin/Extremities: No rash, no edema Neurological Exam: alert and oriented to person, place, and time. No aphasia or dysarthria. Fund of knowledge is appropriate.  Recent and remote memory are intact.  Attention and concentration are normal.    Able to name objects and repeat phrases. Cranial nerves: Pupils equal, round, reactive to light.  Fundoscopic exam unremarkable, no papilledema. Extraocular movements intact with no  nystagmus. Visual fields full. Facial sensation intact. No facial asymmetry. Tongue, uvula, palate midline.  Motor: Bulk and tone normal, muscle strength 5/5 throughout with no pronator drift.  Sensation to light touch intact.  No extinction to double simultaneous stimulation.  Deep tendon reflexes 2+ throughout, toes downgoing.  Finger to nose testing intact.  Gait narrow-based and steady, able to tandem walk adequately.  Romberg negative.  IMPRESSION: This is a pleasant 43 yo RH man with no significant past medical history who presented with new onset recurrent episodes of dizziness of varying intensity, lasting a few minutes, but with sensation of feeling off-balance and increased headaches, as well as pulsing sensation on the right side of his head. MRI brain with no acute abnormalities, no stenosis of intracranial vasculature with contrast sequences. MRI C-spine does show some degenerative changes, symptoms likely due to cervicogenic dizziness and headaches. He feels much improved after physical therapy, denies any further dizziness or headaches. He will follow-up on a prn basis and knows to call our office for any changes.   Thank you for allowing me to participate in his care.  Please do not hesitate to call for any questions or concerns.  The duration of this appointment visit was 15 minutes of face-to-face time with the patient.  Greater than 50% of this time was spent in counseling, explanation of diagnosis, planning of  further management, and coordination of care.   Patrcia Dolly, M.D.   CC: Dr. Manus Gunning

## 2014-05-26 NOTE — Patient Instructions (Signed)
1. Continue home physical therapy exercises 2. Follow-up on as needed basis, call our office for any changes

## 2016-05-04 ENCOUNTER — Other Ambulatory Visit: Payer: Self-pay | Admitting: Physician Assistant

## 2016-05-04 DIAGNOSIS — R1030 Lower abdominal pain, unspecified: Secondary | ICD-10-CM

## 2016-05-10 ENCOUNTER — Ambulatory Visit
Admission: RE | Admit: 2016-05-10 | Discharge: 2016-05-10 | Disposition: A | Discharge status: Home / Self Care | Payer: 59 | Source: Ambulatory Visit | Attending: Physician Assistant | Admitting: Physician Assistant

## 2016-05-10 DIAGNOSIS — R1030 Lower abdominal pain, unspecified: Secondary | ICD-10-CM

## 2016-05-10 MED ORDER — IOPAMIDOL (ISOVUE-300) INJECTION 61%
100.0000 mL | Freq: Once | INTRAVENOUS | Status: AC | PRN
  Administered 2016-05-10: 100 mL via INTRAVENOUS

## 2016-05-20 IMAGING — MR MR CERVICAL SPINE W/O CM
13 of 20 series · 23 of 48 positions shown · IV contrast (multihance)
Comparison: Non contrast head CT 01/20/2014. Brain MRI report
03/10/2002 (no images available).

CLINICAL DATA: 43-year-old male with headache, neck pain,
dizziness, off balance. Tenderness to palpation right side of head
x2 months. Initial encounter. Query demyelinating disease.

EXAM:
MRI HEAD WITHOUT AND WITH CONTRAST
MRI CERVICAL SPINE WITHOUT CONTRAST
TECHNIQUE: Multiplanar, multiecho pulse sequences of the brain and surrounding
structures, and cervical spine, to include the craniocervical
junction and cervicothoracic junction, were obtained without
intravenous contrast.
CONTRAST:  13mL MULTIHANCE GADOBENATE DIMEGLUMINE 529 MG/ML IV SOLN

[Series 3: T1 · sagittal · 5.0mm · 0.47mm/px · 2 of 24 slices shown (1 of 2)]
[im 1/24]
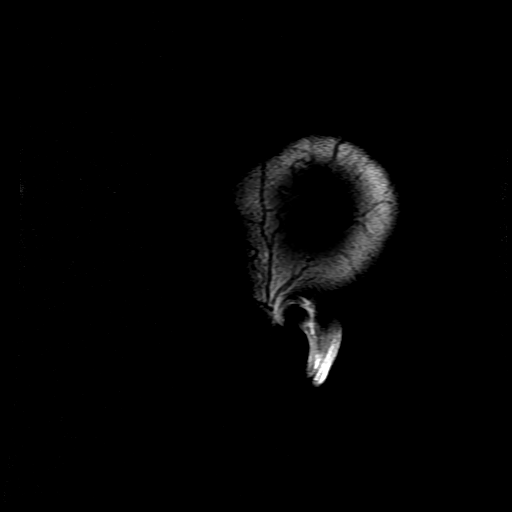
[im 24/24]
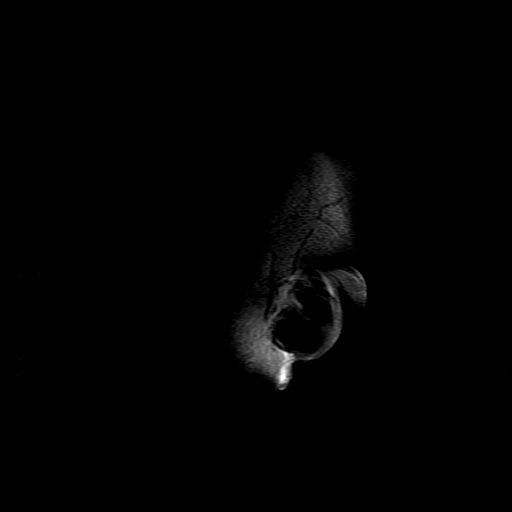

[Series 4: DWI · axial · 5.0mm · 1.09mm/px · z∈[-50,+103]mm · 4 of 64 slices shown (1 of 2)]
[im 1/64]
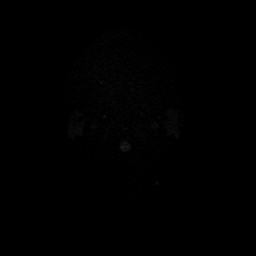
[im 22/64]
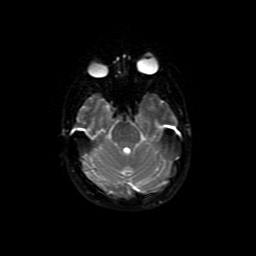
[im 43/64]
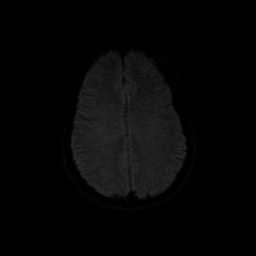
[im 64/64]
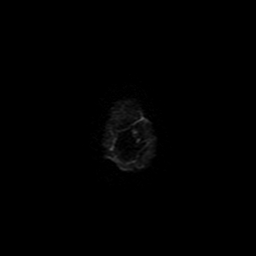

[Series 5: DWI · coronal · 5.0mm · 1.09mm/px · 4 of 70 slices shown (2 of 2)]
[im 1/70]
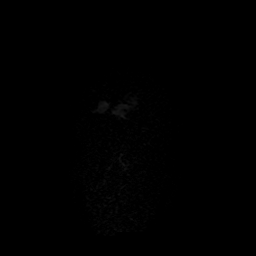
[im 24/70]
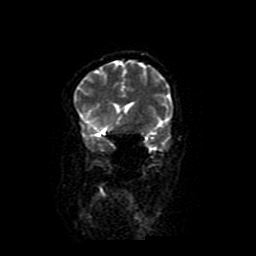
[im 47/70]
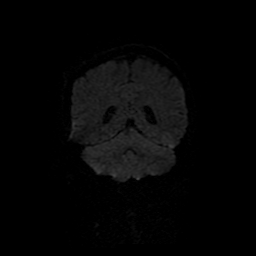
[im 70/70]
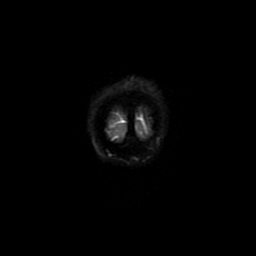

[Series 6: T2 · axial · 5.0mm · 0.43mm/px · 1 of 26 slices shown (1 of 4)]
[im 1/26]
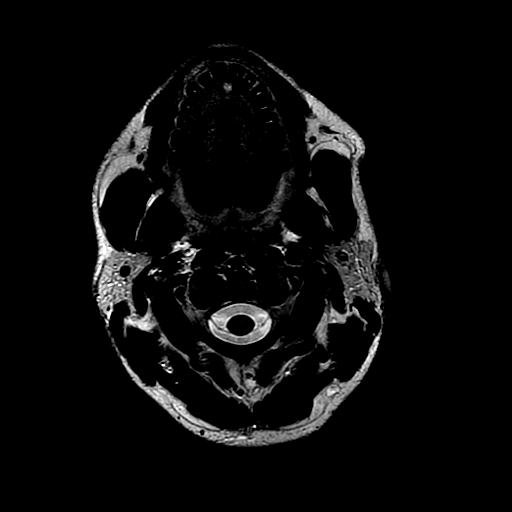

[Series 7: FLAIR · axial · 5.0mm · 0.43mm/px · 1 of 26 slices shown]
[im 1/26]
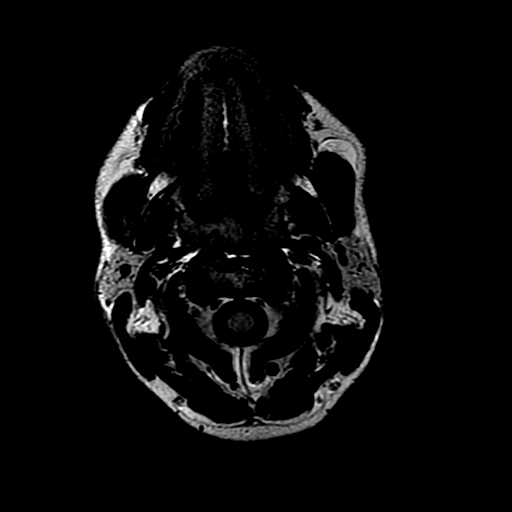

[Series 8: ax mpgr · axial · 5.0mm · 0.43mm/px · 1 of 26 slices shown]
[im 1/26]
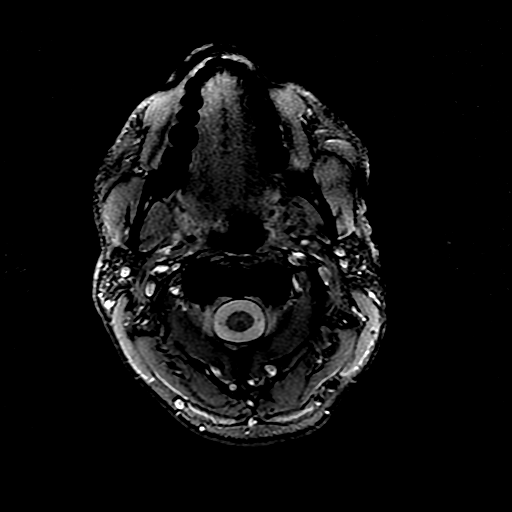

[Series 9: ax fspgr irp · axial · 3.0mm · 0.47mm/px · z∈[-60,+116]mm · 3 of 60 slices shown]
[im 1/60]
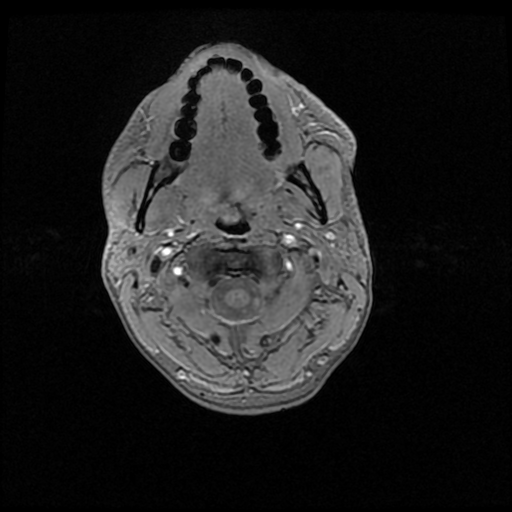
[im 30/60]
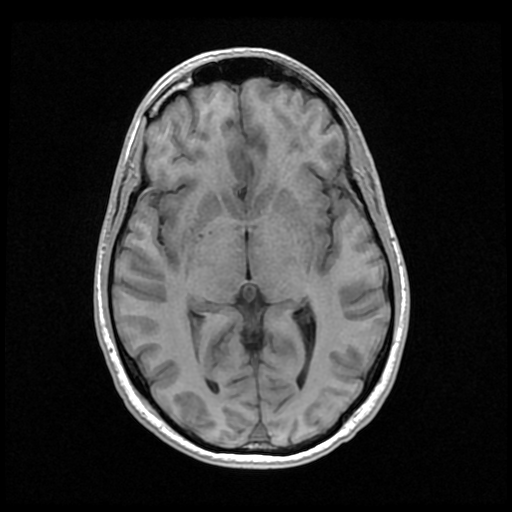
[im 60/60]
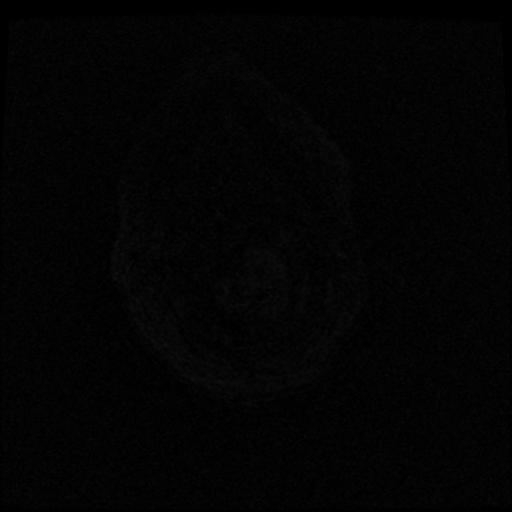

[Series 10: T2 · coronal · 5.0mm · 0.45mm/px · 1 of 27 slices shown (2 of 4)]
[im 1/27]
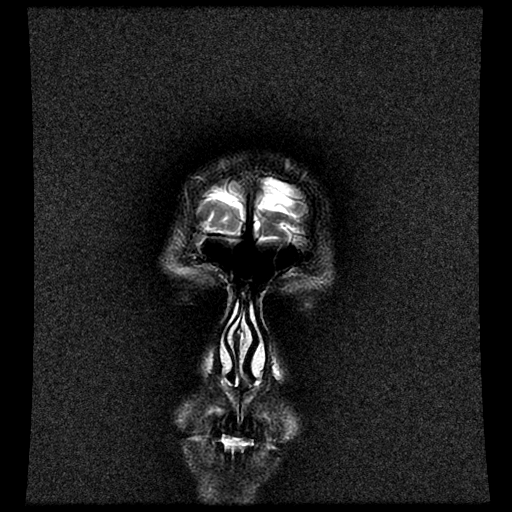

[Series 12: T1 · sagittal · 3.0mm · 0.43mm/px · 1 of 12 slices shown (2 of 2)]
[im 1/12]
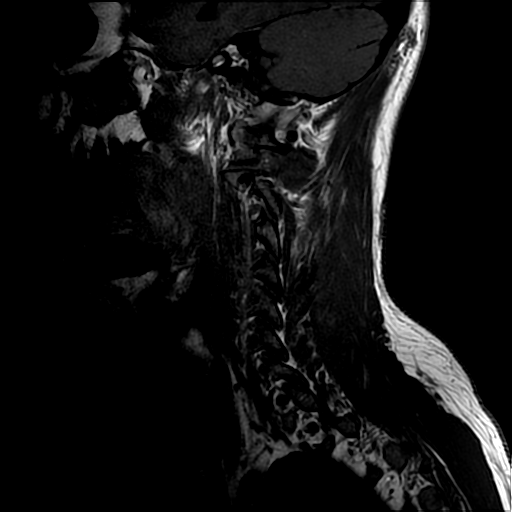

[Series 14: T2 · sagittal · 3.0mm · 0.43mm/px · 1 of 12 slices shown (3 of 4)]
[im 1/12]
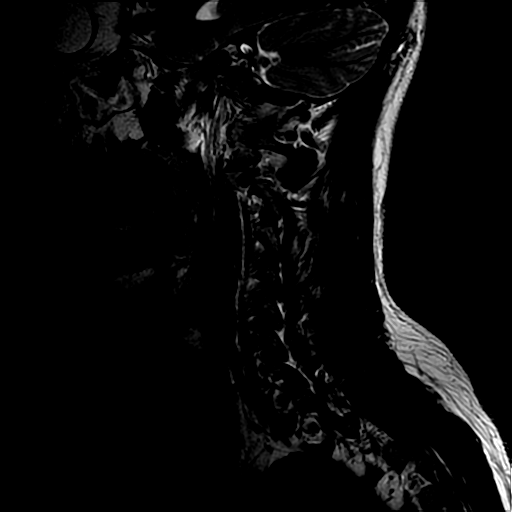

[Series 16: T2 · axial · 3.1mm · 0.35mm/px · z∈[-186,-80]mm · 2 of 31 slices shown (4 of 4)]
[im 1/31]
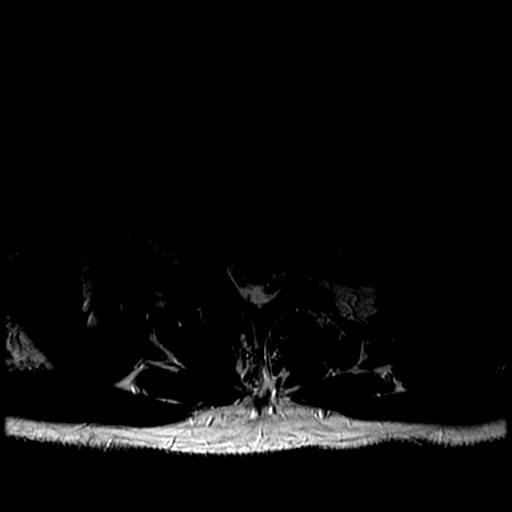
[im 31/31]
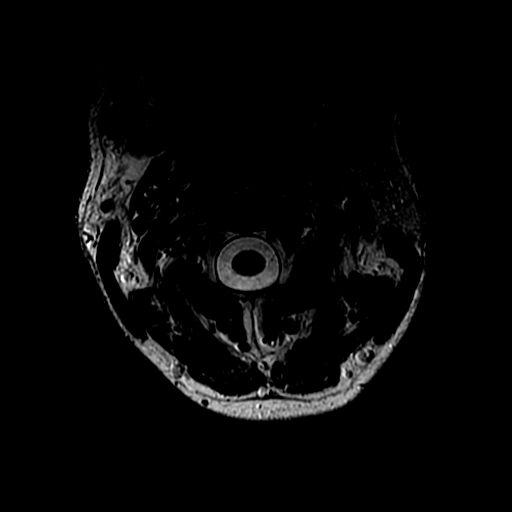

[Series 19: T1 post-contrast · coronal · 5.0mm · 0.45mm/px · 1 of 27 slices shown (1 of 2)]
[im 1/27]
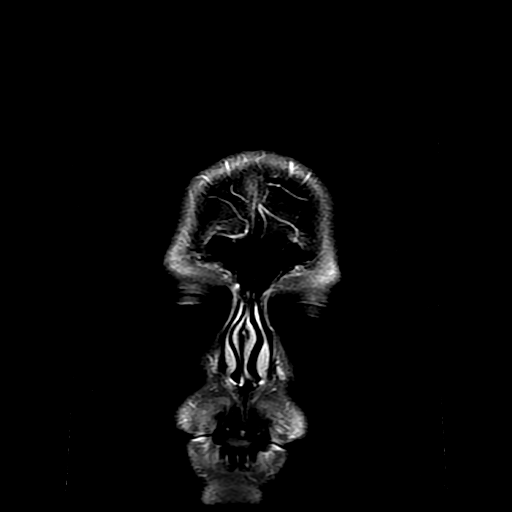

[Series 20: T1 post-contrast · sagittal · 5.0mm · 0.47mm/px · 1 of 24 slices shown (2 of 2)]
[im 1/24]
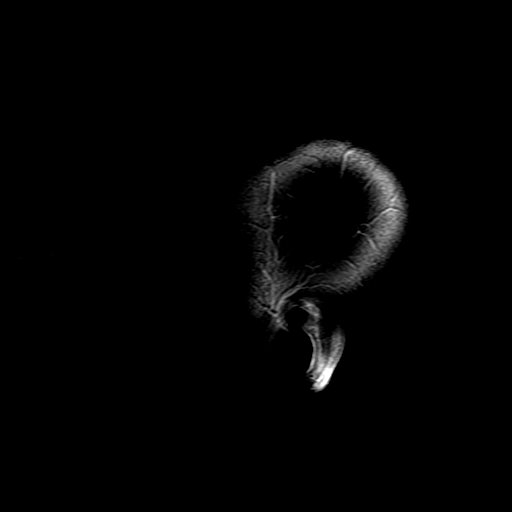

[23 of 48 positions shown; findings below may reference images not displayed]

FINDINGS: MRI HEAD FINDINGS

Cerebral volume is normal. No restricted diffusion to suggest acute
infarction. No midline shift, mass effect, evidence of mass lesion,
ventriculomegaly, extra-axial collection or acute intracranial
hemorrhage. Cervicomedullary junction and pituitary are within
normal limits. Major intracranial vascular flow voids are preserved.
Gray and white matter signal is within normal limits throughout the
brain. No abnormal enhancement identified. No dural thickening.

Visible internal auditory structures appear normal. Mastoids are
clear. Visualized orbit soft tissues are within normal limits.
Hypoplastic right maxillary sinus with mucosal thickening and
inspissated secretions. Other paranasal sinuses are clear. Normal
bone marrow signal. Visualized scalp soft tissues are within normal
limits.

MRI CERVICAL SPINE FINDINGS

Straightening of cervical lordosis. Mild degenerative endplate
marrow changes, most pronounced at C4-C5. No acute osseous
abnormality identified.

Cervicomedullary junction is within normal limits. Spinal cord
signal is within normal limits at all visualized levels.

Negative paraspinal soft tissues.

C2-C3:  Negative.

C3-C4:  Mild right eccentric disc osteophyte complex.  No stenosis.

C4-C5: Mild circumferential disc osteophyte complex and right
greater than left uncovertebral hypertrophy. Minimal to mild facet
hypertrophy. No spinal stenosis. Mild left and mild to moderate
right C5 foraminal stenosis.

C5-C6: Right eccentric circumferential disc osteophyte complex with
moderate uncovertebral and facet hypertrophy. Mild ligament flavum
hypertrophy. Mild spinal stenosis, no spinal cord mass effect or
signal abnormality. Moderate left and severe right C6 foraminal
stenosis.

C6-C7: Mild disc osteophyte complex eccentric to the right with mild
uncovertebral hypertrophy. Mild ligament flavum hypertrophy.
Borderline to mild spinal stenosis. No spinal cord mass effect.
Moderate bilateral C7 foraminal stenosis.

C7-T1:  Negative except for mild facet hypertrophy.

Negative visualized upper thoracic levels.
IMPRESSION: 1. Normal MRI appearance of the brain. No evidence of demyelinating
disease.
2. Mild degenerative cervical spinal stenosis at C5-C6 and C6-C7. No
spinal cord mass effect and normal signal in the visualized cervical
and upper thoracic spinal cord.
3. Moderate to severe degenerative neural foraminal stenosis at the
right C5, bilateral C6, and bilateral C7 nerve levels.
4. Chronic right maxillary sinusitis.

## 2016-12-30 DIAGNOSIS — K219 Gastro-esophageal reflux disease without esophagitis: Secondary | ICD-10-CM | POA: Diagnosis not present

## 2016-12-30 DIAGNOSIS — F419 Anxiety disorder, unspecified: Secondary | ICD-10-CM | POA: Diagnosis not present

## 2016-12-30 DIAGNOSIS — J309 Allergic rhinitis, unspecified: Secondary | ICD-10-CM | POA: Diagnosis not present

## 2018-01-18 DIAGNOSIS — H93299 Other abnormal auditory perceptions, unspecified ear: Secondary | ICD-10-CM | POA: Diagnosis not present

## 2018-02-01 DIAGNOSIS — Z13220 Encounter for screening for lipoid disorders: Secondary | ICD-10-CM | POA: Diagnosis not present

## 2018-02-01 DIAGNOSIS — Z131 Encounter for screening for diabetes mellitus: Secondary | ICD-10-CM | POA: Diagnosis not present

## 2018-02-01 DIAGNOSIS — Z125 Encounter for screening for malignant neoplasm of prostate: Secondary | ICD-10-CM | POA: Diagnosis not present

## 2018-02-01 DIAGNOSIS — Z0000 Encounter for general adult medical examination without abnormal findings: Secondary | ICD-10-CM | POA: Diagnosis not present

## 2018-12-10 DIAGNOSIS — Z20828 Contact with and (suspected) exposure to other viral communicable diseases: Secondary | ICD-10-CM | POA: Diagnosis not present

## 2019-03-08 DIAGNOSIS — Z125 Encounter for screening for malignant neoplasm of prostate: Secondary | ICD-10-CM | POA: Diagnosis not present

## 2019-03-08 DIAGNOSIS — Z131 Encounter for screening for diabetes mellitus: Secondary | ICD-10-CM | POA: Diagnosis not present

## 2019-03-08 DIAGNOSIS — Z23 Encounter for immunization: Secondary | ICD-10-CM | POA: Diagnosis not present

## 2019-03-08 DIAGNOSIS — Z0000 Encounter for general adult medical examination without abnormal findings: Secondary | ICD-10-CM | POA: Diagnosis not present

## 2019-03-08 DIAGNOSIS — Z13220 Encounter for screening for lipoid disorders: Secondary | ICD-10-CM | POA: Diagnosis not present

## 2019-03-21 ENCOUNTER — Ambulatory Visit: Payer: Self-pay

## 2019-03-21 ENCOUNTER — Other Ambulatory Visit: Payer: Self-pay

## 2019-03-21 DIAGNOSIS — Z008 Encounter for other general examination: Secondary | ICD-10-CM

## 2019-03-21 LAB — POCT LIPID PANEL
Glucose Fasting, POC: 88 mg/dL (ref 70–99)
HDL: 42
LDL: 75
Non-HDL: 88
TC/HDL: 3.1
TC: 131
TRG: 66

## 2019-03-21 NOTE — Progress Notes (Signed)
     Patient ID: Jeremiah Bailey, male    DOB: 02-Dec-1970, 48 y.o.   MRN: [id number]    Thank you!!  Apolonio Schneiders RN  [place] Nurse Specialist [place]: [phone]  Cell:  [phone] Website: [email or web address]

## 2019-06-20 DIAGNOSIS — B349 Viral infection, unspecified: Secondary | ICD-10-CM | POA: Diagnosis not present

## 2020-03-27 DIAGNOSIS — Z131 Encounter for screening for diabetes mellitus: Secondary | ICD-10-CM | POA: Diagnosis not present

## 2020-03-27 DIAGNOSIS — Z23 Encounter for immunization: Secondary | ICD-10-CM | POA: Diagnosis not present

## 2020-03-27 DIAGNOSIS — Z0000 Encounter for general adult medical examination without abnormal findings: Secondary | ICD-10-CM | POA: Diagnosis not present

## 2020-03-27 DIAGNOSIS — Z13220 Encounter for screening for lipoid disorders: Secondary | ICD-10-CM | POA: Diagnosis not present

## 2020-04-06 DIAGNOSIS — Z1211 Encounter for screening for malignant neoplasm of colon: Secondary | ICD-10-CM | POA: Diagnosis not present

## 2020-07-07 DIAGNOSIS — J069 Acute upper respiratory infection, unspecified: Secondary | ICD-10-CM | POA: Diagnosis not present

## 2020-07-07 DIAGNOSIS — U071 COVID-19: Secondary | ICD-10-CM | POA: Diagnosis not present

## 2021-04-02 DIAGNOSIS — Z0000 Encounter for general adult medical examination without abnormal findings: Secondary | ICD-10-CM | POA: Diagnosis not present

## 2021-04-02 DIAGNOSIS — Z125 Encounter for screening for malignant neoplasm of prostate: Secondary | ICD-10-CM | POA: Diagnosis not present

## 2021-04-02 DIAGNOSIS — Z131 Encounter for screening for diabetes mellitus: Secondary | ICD-10-CM | POA: Diagnosis not present

## 2021-04-06 DIAGNOSIS — Z1211 Encounter for screening for malignant neoplasm of colon: Secondary | ICD-10-CM | POA: Diagnosis not present

## 2021-06-14 DIAGNOSIS — R519 Headache, unspecified: Secondary | ICD-10-CM | POA: Diagnosis not present

## 2021-06-14 DIAGNOSIS — R030 Elevated blood-pressure reading, without diagnosis of hypertension: Secondary | ICD-10-CM | POA: Diagnosis not present

## 2022-04-11 DIAGNOSIS — Z131 Encounter for screening for diabetes mellitus: Secondary | ICD-10-CM | POA: Diagnosis not present

## 2022-04-11 DIAGNOSIS — Z0000 Encounter for general adult medical examination without abnormal findings: Secondary | ICD-10-CM | POA: Diagnosis not present

## 2022-04-11 DIAGNOSIS — E785 Hyperlipidemia, unspecified: Secondary | ICD-10-CM | POA: Diagnosis not present

## 2022-04-15 DIAGNOSIS — Z1211 Encounter for screening for malignant neoplasm of colon: Secondary | ICD-10-CM | POA: Diagnosis not present

## 2022-08-02 DIAGNOSIS — K4090 Unilateral inguinal hernia, without obstruction or gangrene, not specified as recurrent: Secondary | ICD-10-CM | POA: Diagnosis not present

## 2022-08-02 DIAGNOSIS — M545 Low back pain, unspecified: Secondary | ICD-10-CM | POA: Diagnosis not present

## 2022-10-20 DIAGNOSIS — K4091 Unilateral inguinal hernia, without obstruction or gangrene, recurrent: Secondary | ICD-10-CM | POA: Diagnosis not present

## 2022-11-15 DIAGNOSIS — K4091 Unilateral inguinal hernia, without obstruction or gangrene, recurrent: Secondary | ICD-10-CM | POA: Diagnosis not present

## 2022-11-15 DIAGNOSIS — G8918 Other acute postprocedural pain: Secondary | ICD-10-CM | POA: Diagnosis not present

## 2023-04-13 DIAGNOSIS — Z0000 Encounter for general adult medical examination without abnormal findings: Secondary | ICD-10-CM | POA: Diagnosis not present

## 2023-04-13 DIAGNOSIS — F419 Anxiety disorder, unspecified: Secondary | ICD-10-CM | POA: Diagnosis not present

## 2023-04-13 DIAGNOSIS — J309 Allergic rhinitis, unspecified: Secondary | ICD-10-CM | POA: Diagnosis not present

## 2023-04-13 DIAGNOSIS — K219 Gastro-esophageal reflux disease without esophagitis: Secondary | ICD-10-CM | POA: Diagnosis not present

## 2023-04-13 DIAGNOSIS — Z125 Encounter for screening for malignant neoplasm of prostate: Secondary | ICD-10-CM | POA: Diagnosis not present

## 2023-04-13 DIAGNOSIS — Z1159 Encounter for screening for other viral diseases: Secondary | ICD-10-CM | POA: Diagnosis not present

## 2023-04-13 DIAGNOSIS — F411 Generalized anxiety disorder: Secondary | ICD-10-CM | POA: Diagnosis not present

## 2023-04-13 DIAGNOSIS — Z23 Encounter for immunization: Secondary | ICD-10-CM | POA: Diagnosis not present

## 2023-04-13 DIAGNOSIS — Z13220 Encounter for screening for lipoid disorders: Secondary | ICD-10-CM | POA: Diagnosis not present

## 2023-04-20 DIAGNOSIS — Z1211 Encounter for screening for malignant neoplasm of colon: Secondary | ICD-10-CM | POA: Diagnosis not present

## 2024-01-23 DIAGNOSIS — R491 Aphonia: Secondary | ICD-10-CM | POA: Diagnosis not present

## 2024-02-19 DIAGNOSIS — J382 Nodules of vocal cords: Secondary | ICD-10-CM | POA: Diagnosis not present

## 2024-02-19 DIAGNOSIS — K219 Gastro-esophageal reflux disease without esophagitis: Secondary | ICD-10-CM | POA: Diagnosis not present

## 2024-02-19 DIAGNOSIS — R490 Dysphonia: Secondary | ICD-10-CM | POA: Diagnosis not present

## 2024-03-06 ENCOUNTER — Ambulatory Visit: Attending: Otolaryngology | Admitting: Speech Pathology

## 2024-03-06 DIAGNOSIS — R490 Dysphonia: Secondary | ICD-10-CM | POA: Diagnosis not present

## 2024-03-06 NOTE — Therapy (Signed)
 OUTPATIENT SPEECH LANGUAGE PATHOLOGY  VOICE EVALUATION   Patient Name: Jeremiah Bailey MRN: [id number] DOB:1971-05-29, 53 y.o., male Today's Date: 03/06/2024  PCP: Hugh Charleston, MD REFERRING PROVIDER: Blair Mt, MD   End of Session - 03/06/24 1538     Visit Number 1    Number of Visits 17    Date for SLP Re-Evaluation 05/01/24    Authorization Type [employer] [facility]    Authorization - Visit Number 1    Authorization - Number of Visits 30    Progress Note Due on Visit 10    SLP Start Time 1445    SLP Stop Time  1535    SLP Time Calculation (min) 50 min    Activity Tolerance Patient tolerated treatment well          Past Medical History:  Diagnosis Date   Dizziness and giddiness    Past Surgical History:  Procedure Laterality Date   UMBILICAL HERNIA REPAIR     Patient Active Problem List   Diagnosis Date Noted   Dizziness 02/22/2014   Numbness 02/22/2014   Neck pain 02/22/2014   Pulsatile tinnitus 02/22/2014    ONSET DATE:  5 months ago; date of referral 02/19/2024  REFERRING DIAG: Dysphonia (R49.) 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 He notes his hoarseness is slightly better in the morning but worsens through the day. He does work in a environment where he has to raise his voice quite a bit to communicate with other workers.   Laryngoscopy revealed vocal cords are mobile but there is a small nodule on the lft mid cord. Posterior glottis reveals some erythema and pachyderma and what looks to be healing of the right arytenoid where he may have had a small ulcer. left middle true vocal fold:  nodule.    PAIN:  Are you having pain? No   FALLS: Has patient fallen in last 6 months? No  LIVING ENVIRONMENT: Lives with: lives with their family and lives with their spouse Lives in: House/apartment  PLOF: Independent  PATIENT GOALS    To improve voice  OBJECTIVE:  COGNITION: Overall cognitive status: Within functional limits for tasks assessed  SOCIAL HISTORY: Occupation: Merchandiser, retail for heavy Photographer intake: optimal Caffeine/alcohol intake: moderate Daily voice use: moderate and excessive Environmental risks: Noisy environment, Stressful environment, Dry or dusty environment, and [employer] environment Occupational risks: Other:  Misuse: Speaks excessively, Glottal fry, Glottal attack, Strain, Tension, Speaks without adequate warm-up, and Other: Phonotraumatic behaviors: Speaks in noise, Performs or speaks to large groups without amplification, Speaks at a great distance from listeners, Excessive voice use, Excessive voice use during colds/illnesses, and Screams or shouts frequently  PERCEPTUAL VOICE ASSESSMENT: Voice quality: hoarse, breathy, harsh, rough, strained, low vocal intensity, diplophonia, and vocal fatigue Vocal abuse: abnormal breathing pattern, habitual loudness, and excessive voice use Resonance: normal Respiratory function: clavicular breathing  OBJECTIVE VOICE ASSESSMENT: Sustained ah maximum phonation time: 4.3 seconds Sustained ah loudness average: 69 dB Average fundamental frequency during sustained "ah":117 Hz   (1.3 SD below average of  145 Hz +/- 23 for gender)  Oral reading (passage) loudness  average: 75 dB Oral reading loudness range: 23 dB Conversational pitch average: 76 Hz Highest dynamic pitch in conversational speech: 185 Hz Lowest dynamic pitch in conversational speech: 105 Hz Conversational loudness average: 74 dB Conversational loudness range: 22 dB S/z ratio: 1.5 (Suggestive of dysfunction >1.0) Voice quality:  hoarse, breathy, harsh, rough, strained, low vocal intensity, diplophonia, and vocal fatigue     ORAL MOTOR EXAMINATION Facial : WFL Lingual: WFL Velum: WFL Mandible: WFL Cough: WFL   PATIENT REPORTED OUTCOME MEASURES (PROM):  VOICE HANDICAP INDEX (VHI)  The Voice Handicap Index is comprised of a series of questions to assess the patient's perception of their voice. It is designed to evaluate the emotional, physical and functional components of the voice problem.  Functional: 8 Physical: 17 Emotional: 4 Total: 29 (Normal mean 8.75, SD =14.97)  z score =  1.35 (mild = 1.01-1.99)  TODAY'S TREATMENT:  Skilled written and verbal education provided on reflux precautions.   PATIENT EDUCATION: Education details: Results of this assessment and ST POC Person educated: Patient Education method: Explanation Education comprehension: needs further education   HOME EXERCISE PROGRAM: Implement reflux precautions     GOALS: Goals reviewed with patient? Yes  SHORT TERM GOALS: Target date: 10 sessions  The patient will maximize voice quality and loudness using breath support/oral resonance for sustained vowel production, pitch glides, and hierarchal speech drill.  Baseline: Goal status: INITIAL  2.  The patient will increase hydration for an eventual goal of 6-8 glasses per day and limit caffeine intake (to maximum of 1-2, 8 oz cups/day), as measured by patient report.  Baseline:  Goal status: INITIAL   LONG TERM GOALS: Target date: 05/01/2024  The patient will demonstrate independent understanding of vocal hygiene concepts.  Baseline:  Goal status: INITIAL  2.  Patient will report improved communication effectiveness as measured by PROM Baseline:  Goal status: INITIAL  3.  Pt will identify healthy voice alternatives and ways to promote vocal health in 80% of opportunities across 3 data collections.  Baseline:  Goal status: INITIAL   ASSESSMENT:  CLINICAL  IMPRESSION: Patient is a 53 y.o. 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   Pt presents with moderate dysphonia that is c/b hoarse, breathy, raspy, rough vocal quality that is lower in pitch with diplophonia present.    OBJECTIVE IMPAIRMENTS include voice disorder. These impairments are limiting patient from effectively communicating at home and in community. Factors affecting potential to achieve goals and functional outcome are loud work environment. Patient will benefit from skilled SLP services to address above impairments and improve overall function.  REHAB POTENTIAL: Good  PLAN: SLP FREQUENCY: 1-2x/week  SLP DURATION: 8 weeks  PLANNED INTERVENTIONS: SLP instruction and feedback and Patient/family education   Shanin Szymanowski B. Rubbie, M.S., CCC-SLP, Tree surgeon Certified Brain Injury Specialist [facility]  [facility] Rehabilitation Services Office [phone] Ascom [phone] Fax [phone]

## 2024-03-07 ENCOUNTER — Ambulatory Visit: Admitting: Speech Pathology

## 2024-03-11 ENCOUNTER — Ambulatory Visit: Admitting: Speech Pathology

## 2024-03-11 DIAGNOSIS — R490 Dysphonia: Secondary | ICD-10-CM

## 2024-03-11 NOTE — Therapy (Unsigned)
 OUTPATIENT SPEECH LANGUAGE PATHOLOGY  VOICE TREATMENT   Patient Name: Jeremiah Bailey MRN: [id number] DOB:Nov 21, 1970, 53 y.o., male Today's Date: 03/11/2024  PCP: Hugh Charleston, MD REFERRING PROVIDER: Blair Mt, MD   End of Session - 03/11/24 1528     Visit Number 2    Number of Visits 17    Date for SLP Re-Evaluation 05/01/24    Authorization Type [employer] [facility]    Authorization - Visit Number 2    Authorization - Number of Visits 30    Progress Note Due on Visit 10    SLP Start Time 1530    SLP Stop Time  1608    SLP Time Calculation (min) 38 min    Activity Tolerance Patient tolerated treatment well          Past Medical History:  Diagnosis Date   Dizziness and giddiness    Past Surgical History:  Procedure Laterality Date   UMBILICAL HERNIA REPAIR     Patient Active Problem List   Diagnosis Date Noted   Dizziness 02/22/2014   Numbness 02/22/2014   Neck pain 02/22/2014   Pulsatile tinnitus 02/22/2014    ONSET DATE:  5 months ago; date of referral 02/19/2024  REFERRING DIAG: Dysphonia (R49.) 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 He notes his hoarseness is slightly better in the morning but worsens through the day. He does work in a environment where he has to raise his voice quite a bit to communicate with other workers.   Laryngoscopy revealed vocal cords are mobile but there is a small nodule on the lft mid cord. Posterior glottis reveals some erythema and pachyderma and what looks to be healing of the right arytenoid where he may have had a small ulcer. left middle true vocal fold:  nodule.    PAIN:  Are you having pain? No   FALLS: Has patient fallen in last 6 months? No  LIVING ENVIRONMENT: Lives with: lives with their family and lives with their spouse Lives in: House/apartment  PLOF: Independent  PATIENT GOALS    To improve voice  OBJECTIVE:  COGNITION: Overall cognitive status: Within functional limits for tasks assessed  SOCIAL HISTORY: Occupation: Merchandiser, retail for heavy Photographer intake: optimal Caffeine/alcohol intake: moderate Daily voice use: moderate and excessive Environmental risks: Noisy environment, Stressful environment, Dry or dusty environment, and [employer] environment Occupational risks: Other:  Misuse: Speaks excessively, Glottal fry, Glottal attack, Strain, Tension, Speaks without adequate warm-up, and Other: Phonotraumatic behaviors: Speaks in noise, Performs or speaks to large groups without amplification, Speaks at a great distance from listeners, Excessive voice use, Excessive voice use during colds/illnesses, and Screams or shouts frequently  PERCEPTUAL VOICE ASSESSMENT: Voice quality: hoarse, breathy, harsh, rough, strained, low vocal intensity, diplophonia, and vocal fatigue Vocal abuse: abnormal breathing pattern, habitual loudness, and excessive voice use Resonance: normal Respiratory function: clavicular breathing  OBJECTIVE VOICE ASSESSMENT: Sustained ah maximum phonation time: 4.3 seconds Sustained ah loudness average: 69 dB Average fundamental frequency during sustained "ah":117 Hz   (1.3 SD below average of  145 Hz +/- 23 for gender)  Oral reading (passage) loudness  average: 75 dB Oral reading loudness range: 23 dB Conversational pitch average: 76 Hz Highest dynamic pitch in conversational speech: 185 Hz Lowest dynamic pitch in conversational speech: 105 Hz Conversational loudness average: 74 dB Conversational loudness range: 22 dB S/z ratio: 1.5 (Suggestive of dysfunction >1.0) Voice quality:  hoarse, breathy, harsh, rough, strained, low vocal intensity, diplophonia, and vocal fatigue     ORAL MOTOR EXAMINATION Facial : WFL Lingual: WFL Velum: WFL Mandible: WFL Cough: WFL   PATIENT REPORTED OUTCOME MEASURES (PROM):  VOICE HANDICAP INDEX (VHI)  The Voice Handicap Index is comprised of a series of questions to assess the patient's perception of their voice. It is designed to evaluate the emotional, physical and functional components of the voice problem.  Functional: 8 Physical: 17 Emotional: 4 Total: 29 (Normal mean 8.75, SD =14.97)  z score =  1.35 (mild = 1.01-1.99)  TODAY'S TREATMENT:  Skilled written and verbal education provided on reflux precautions.   PATIENT EDUCATION: Education details: Results of this assessment and ST POC Person educated: Patient Education method: Explanation Education comprehension: needs further education   HOME EXERCISE PROGRAM: Implement reflux precautions     GOALS: Goals reviewed with patient? Yes  SHORT TERM GOALS: Target date: 10 sessions  The patient will maximize voice quality and loudness using breath support/oral resonance for sustained vowel production, pitch glides, and hierarchal speech drill.  Baseline: Goal status: INITIAL  2.  The patient will increase hydration for an eventual goal of 6-8 glasses per day and limit caffeine intake (to maximum of 1-2, 8 oz cups/day), as measured by patient report.  Baseline:  Goal status: INITIAL   LONG TERM GOALS: Target date: 05/01/2024  The patient will demonstrate independent understanding of vocal hygiene concepts.  Baseline:  Goal status: INITIAL  2.  Patient will report improved communication effectiveness as measured by PROM Baseline:  Goal status: INITIAL  3.  Pt will identify healthy voice alternatives and ways to promote vocal health in 80% of opportunities across 3 data collections.  Baseline:  Goal status: INITIAL   ASSESSMENT:  CLINICAL  IMPRESSION: Patient is a 53 y.o. 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   Pt presents with moderate dysphonia that is c/b hoarse, breathy, raspy, rough vocal quality that is lower in pitch with diplophonia present.    OBJECTIVE IMPAIRMENTS include voice disorder. These impairments are limiting patient from effectively communicating at home and in community. Factors affecting potential to achieve goals and functional outcome are loud work environment. Patient will benefit from skilled SLP services to address above impairments and improve overall function.  REHAB POTENTIAL: Good  PLAN: SLP FREQUENCY: 1-2x/week  SLP DURATION: 8 weeks  PLANNED INTERVENTIONS: SLP instruction and feedback and Patient/family education   Happi B. Rubbie, M.S., CCC-SLP, Tree surgeon Certified Brain Injury Specialist [facility]  [facility] Rehabilitation Services Office [phone] Ascom [phone] Fax [phone]

## 2024-03-13 ENCOUNTER — Ambulatory Visit: Admitting: Speech Pathology

## 2024-03-13 DIAGNOSIS — R490 Dysphonia: Secondary | ICD-10-CM | POA: Diagnosis not present

## 2024-03-13 NOTE — Therapy (Unsigned)
 OUTPATIENT SPEECH LANGUAGE PATHOLOGY  VOICE TREATMENT   Patient Name: Jeremiah Bailey MRN: [id number] DOB:[birth year], 53 y.o., male Today's Date: 03/13/2024  PCP: Hugh Charleston, MD REFERRING PROVIDER: Blair Mt, MD   End of Session - 03/13/24 1555     Visit Number 3    Number of Visits 17    Date for SLP Re-Evaluation 05/01/24    Authorization Type [employer] Cross [facility]    Authorization - Visit Number 3    Authorization - Number of Visits 30    Progress Note Due on Visit 10    SLP Start Time 1525    SLP Stop Time  1555    SLP Time Calculation (min) 30 min    Activity Tolerance Patient tolerated treatment well          Past Medical History:  Diagnosis Date   Dizziness and giddiness    Past Surgical History:  Procedure Laterality Date   UMBILICAL HERNIA REPAIR     Patient Active Problem List   Diagnosis Date Noted   Dizziness 02/22/2014   Numbness 02/22/2014   Neck pain 02/22/2014   Pulsatile tinnitus 02/22/2014    ONSET DATE:  5 months ago; date of referral 02/19/2024  REFERRING DIAG: 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 He notes his hoarseness is slightly better in the morning but worsens through the day. He does work in a environment where he has to raise his voice quite a bit to communicate with other workers.   Laryngoscopy revealed vocal cords are mobile but there is a small nodule on the lft mid cord. Posterior glottis reveals some erythema and pachyderma and what looks to be healing of the right arytenoid where he may have had a small ulcer. left middle true vocal fold: nodule.    PAIN:  Are you having pain? No   FALLS: Has patient fallen  in last 6 months? No  LIVING ENVIRONMENT: Lives with: lives with their family and lives with their spouse Lives in: House/apartment  PLOF: Independent  PATIENT GOALS    To improve voice  SUBJECTIVE STATEMENT: Pt eager, good historian Pt accompanied by: self  OBJECTIVE:   TODAY'S TREATMENT:  Skilled treatment session targeted pt's dysphonia goals. SLP facilitated session by providing the following interventions:  Pt states that he has started using closer physical distances when talking over noise instead of talking over noise at greater distances.   Skilled verbal and written information provided on HOW THE VOICE WORKS with details targeted on using respiratory support to achieve improved volume vs laryngopharyngeal focus.   Skilled instruction provided in abdominal breathing with demonstration. Assigned for practice at home.   PATIENT EDUCATION: Education details: see above Person educated: Patient Education method: Explanation Education comprehension: needs further education   HOME EXERCISE PROGRAM: Implement reflux precautions Practice abdominal breathing in front of a mirror or while laying on flat surface.      GOALS: Goals reviewed with patient? Yes  SHORT TERM GOALS: Target date: 10 sessions  The patient will maximize voice quality and loudness using breath support/oral resonance for sustained vowel production, pitch glides, and hierarchal speech drill.  Baseline: Goal status: INITIAL  2.  The patient will increase hydration for an eventual goal of 6-8 glasses per day and limit caffeine intake (to maximum of 1-2, 8 oz cups/day), as measured by patient report.  Baseline:  Goal status: INITIAL   LONG TERM GOALS: Target date: 05/01/2024  The patient will demonstrate independent understanding of vocal hygiene concepts.  Baseline:  Goal status: INITIAL  2.  Patient will report improved communication effectiveness as measured by PROM Baseline:  Goal  status: INITIAL  3.  Pt will identify healthy voice alternatives and ways to promote vocal health in 80% of opportunities across 3 data collections.  Baseline:  Goal status: INITIAL   ASSESSMENT:  CLINICAL IMPRESSION: Patient is a 53 y.o. 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   Pt presents with moderate dysphonia that is c/b hoarse, breathy, raspy, rough vocal quality that is lower in pitch with diplophonia present.    Pt responded with eagerness to the above therapy activities. See the above treatment note for details.   OBJECTIVE IMPAIRMENTS include voice disorder. These impairments are limiting patient from effectively communicating at home and in community. Factors affecting potential to achieve goals and functional outcome are loud work environment. Patient will benefit from skilled SLP services to address above impairments and improve overall function.  REHAB POTENTIAL: Good  PLAN: SLP FREQUENCY: 1-2x/week  SLP DURATION: 8 weeks  PLANNED INTERVENTIONS: SLP instruction and feedback and Patient/family education    Truth Wolaver B. Rubbie, M.S., CCC-SLP, Tree surgeon Certified Brain Injury Specialist [facility]  [facility] Rehabilitation Services Office [phone] Ascom [phone] Fax [phone]

## 2024-03-18 ENCOUNTER — Ambulatory Visit: Admitting: Speech Pathology

## 2024-03-18 DIAGNOSIS — R490 Dysphonia: Secondary | ICD-10-CM | POA: Diagnosis not present

## 2024-03-18 NOTE — Therapy (Unsigned)
 OUTPATIENT SPEECH LANGUAGE PATHOLOGY  VOICE TREATMENT   Patient Name: Jeremiah Bailey MRN: [id number] DOB:1971/03/02, 53 y.o., male Today's Date: 03/19/2024  PCP: Hugh Charleston, MD REFERRING PROVIDER: Blair Mt, MD   End of Session - 03/18/24 1451     Visit Number 4    Number of Visits 17    Date for Recertification  05/01/24    Authorization Type [employer] Cross [facility]    Authorization - Visit Number 4    Authorization - Number of Visits 30    Progress Note Due on Visit 10    SLP Start Time 1450    SLP Stop Time  1530    SLP Time Calculation (min) 40 min    Activity Tolerance Patient tolerated treatment well          Past Medical History:  Diagnosis Date   Dizziness and giddiness    Past Surgical History:  Procedure Laterality Date   UMBILICAL HERNIA REPAIR     Patient Active Problem List   Diagnosis Date Noted   Dizziness 02/22/2014   Numbness 02/22/2014   Neck pain 02/22/2014   Pulsatile tinnitus 02/22/2014    ONSET DATE:  5 months ago; date of referral 02/19/2024  REFERRING DIAG: 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 He notes his hoarseness is slightly better in the morning but worsens through the day. He does work in a environment where he has to raise his voice quite a bit to communicate with other workers.   Laryngoscopy revealed vocal cords are mobile but there is a small nodule on the lft mid cord. Posterior glottis reveals some erythema and pachyderma and what looks to be healing of the right arytenoid where he may have had a small ulcer. left middle true vocal fold: nodule.    PAIN:  Are you having pain? No   FALLS: Has patient fallen  in last 6 months? No  LIVING ENVIRONMENT: Lives with: lives with their family and lives with their spouse Lives in: House/apartment  PLOF: Independent  PATIENT GOALS    To improve voice  SUBJECTIVE STATEMENT: Pt eager, good historian Pt accompanied by: self  OBJECTIVE:   TODAY'S TREATMENT:  Skilled treatment session targeted pt's dysphonia goals. SLP facilitated session by providing the following interventions:  Pt with perceptional improved dysphonia during today's session! He reports improved attempts at vocal rest at home and not singing during church as well as several ways to close the distance when communicating over noise at work.   Pt also utilizing abdominal breathing as well as vocal warms up to maximal effective vocal cord movement.   Pt independent with humming glides with much improved steady vocal quality and no cracks or aphonia.   PATIENT EDUCATION: Education details: see above Person educated: Patient Education method: Explanation Education comprehension: needs further education   HOME EXERCISE PROGRAM: Implement reflux precautions Practice abdominal breathing in front of a mirror or while laying on flat surface.      GOALS: Goals reviewed with patient? Yes  SHORT TERM GOALS: Target date: 10 sessions  The patient will maximize voice quality and loudness using breath support/oral resonance for sustained vowel production, pitch glides, and  hierarchal speech drill.  Baseline: Goal status: INITIAL  2.  The patient will increase hydration for an eventual goal of 6-8 glasses per day and limit caffeine intake (to maximum of 1-2, 8 oz cups/day), as measured by patient report.  Baseline:  Goal status: INITIAL   LONG TERM GOALS: Target date: 05/01/2024  The patient will demonstrate independent understanding of vocal hygiene concepts.  Baseline:  Goal status: INITIAL  2.  Patient will report improved communication effectiveness as measured by  PROM Baseline:  Goal status: INITIAL  3.  Pt will identify healthy voice alternatives and ways to promote vocal health in 80% of opportunities across 3 data collections.  Baseline:  Goal status: INITIAL   ASSESSMENT:  CLINICAL IMPRESSION: Patient is a 53 y.o. 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   Pt presents with improving dysphonia that is c/b hoarse, breathy, raspy, rough vocal quality that is lower in pitch with diplophonia present.    Pt responded with eagerness to the above therapy activities. See the above treatment note for details.   OBJECTIVE IMPAIRMENTS include voice disorder. These impairments are limiting patient from effectively communicating at home and in community. Factors affecting potential to achieve goals and functional outcome are loud work environment. Patient will benefit from skilled SLP services to address above impairments and improve overall function.  REHAB POTENTIAL: Good  PLAN: SLP FREQUENCY: 1-2x/week  SLP DURATION: 8 weeks  PLANNED INTERVENTIONS: SLP instruction and feedback and Patient/family education   Hassell Roys, SLP Student Clinician  Happi B. Rubbie, M.S., CCC-SLP, Tree surgeon Certified Brain Injury Specialist [facility]  [facility] Rehabilitation Services Office [phone] Ascom [phone] Fax [phone]

## 2024-03-20 ENCOUNTER — Ambulatory Visit: Admitting: Speech Pathology

## 2024-03-20 DIAGNOSIS — R490 Dysphonia: Secondary | ICD-10-CM | POA: Diagnosis not present

## 2024-03-20 NOTE — Therapy (Signed)
 OUTPATIENT SPEECH LANGUAGE PATHOLOGY  VOICE TREATMENT   Patient Name: Jeremiah Bailey MRN: [id number] DOB:[birth year], 53 y.o., male Today's Date: 03/20/2024  PCP: Hugh Charleston, MD REFERRING PROVIDER: Blair Mt, MD   End of Session - 03/20/24 1703     Visit Number 5    Number of Visits 17    Date for Recertification  05/01/24    Authorization Type [employer] Cross [facility]    Authorization - Visit Number 5    Authorization - Number of Visits 30    Progress Note Due on Visit 10    SLP Start Time 1530    SLP Stop Time  1610    SLP Time Calculation (min) 40 min    Activity Tolerance Patient tolerated treatment well          Past Medical History:  Diagnosis Date   Dizziness and giddiness    Past Surgical History:  Procedure Laterality Date   UMBILICAL HERNIA REPAIR     Patient Active Problem List   Diagnosis Date Noted   Dizziness 02/22/2014   Numbness 02/22/2014   Neck pain 02/22/2014   Pulsatile tinnitus 02/22/2014    ONSET DATE:  5 months ago; date of referral 02/19/2024  REFERRING DIAG: 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 He notes his hoarseness is slightly better in the morning but worsens through the day. He does work in a environment where he has to raise his voice quite a bit to communicate with other workers.   Laryngoscopy revealed vocal cords are mobile but there is a small nodule on the lft mid cord. Posterior glottis reveals some erythema and pachyderma and what looks to be healing of the right arytenoid where he may have had a small ulcer. left middle true vocal fold: nodule.    PAIN:  Are you having pain? No   FALLS: Has patient fallen  in last 6 months? No  LIVING ENVIRONMENT: Lives with: lives with their family and lives with their spouse Lives in: House/apartment  PLOF: Independent  PATIENT GOALS    To improve voice  SUBJECTIVE STATEMENT: Pt eager, good historian Pt accompanied by: self  OBJECTIVE:   TODAY'S TREATMENT:  Skilled treatment session targeted pt's dysphonia goals. SLP facilitated session by providing the following interventions:  Pt with perceptional mild increase in dysphonia today as compared to [facility] session. He reports it is getting better, I am on the right track.  Skilled verbal and written education provided on phono-traumatic behaviors of whispering and instruction provided on use of confidential voice with demonstration and examples provided.   Prevention of pitch breaks and improving respiratory support taught with instruction provided on use of easy onset breathy phonation to decreased pharyngeal focus and strain. Pt able to return demonstration.   PATIENT EDUCATION: Education details: see above Person educated: Patient Education method: Explanation Education comprehension: needs further education   HOME EXERCISE PROGRAM: As above    GOALS: Goals reviewed with patient? Yes  SHORT TERM GOALS: Target date: 10 sessions  The patient will maximize voice quality and loudness using breath support/oral resonance for sustained vowel production, pitch glides, and hierarchal speech drill.  Baseline: Goal status: INITIAL  2.  The patient will increase hydration for an eventual goal of 6-8 glasses per day and limit caffeine intake (to maximum of 1-2, 8 oz cups/day), as measured by patient report.  Baseline:  Goal status: INITIAL   LONG TERM GOALS: Target date: 05/01/2024  The patient will demonstrate independent understanding of vocal hygiene concepts.  Baseline:  Goal status: INITIAL  2.  Patient will report improved communication effectiveness as measured by  PROM Baseline:  Goal status: INITIAL  3.  Pt will identify healthy voice alternatives and ways to promote vocal health in 80% of opportunities across 3 data collections.  Baseline:  Goal status: INITIAL   ASSESSMENT:  CLINICAL IMPRESSION: Patient is a 53 y.o. 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   Pt presents with improving dysphonia that is c/b hoarse, breathy, raspy, rough vocal quality that is lower in pitch with diplophonia present.    Pt responded with eagerness to the above therapy activities. See the above treatment note for details.   OBJECTIVE IMPAIRMENTS include voice disorder. These impairments are limiting patient from effectively communicating at home and in community. Factors affecting potential to achieve goals and functional outcome are loud work environment. Patient will benefit from skilled SLP services to address above impairments and improve overall function.  REHAB POTENTIAL: Good  PLAN: SLP FREQUENCY: 1-2x/week  SLP DURATION: 8 weeks  PLANNED INTERVENTIONS: SLP instruction and feedback and Patient/family education     Aarav Burgett B. Rubbie, M.S., CCC-SLP, Tree surgeon Certified Brain Injury Specialist [facility]  [facility] Rehabilitation Services Office [phone] Ascom [phone] Fax [phone]

## 2024-03-25 ENCOUNTER — Ambulatory Visit: Admitting: Speech Pathology

## 2024-03-25 DIAGNOSIS — R490 Dysphonia: Secondary | ICD-10-CM

## 2024-03-26 NOTE — Therapy (Signed)
 OUTPATIENT SPEECH LANGUAGE PATHOLOGY  VOICE TREATMENT   Patient Name: Jeremiah Bailey MRN: [id number] DOB:11-20-1970, 53 y.o., male Today's Date: 03/26/2024  PCP: Hugh Charleston, MD REFERRING PROVIDER: Blair Mt, MD   End of Session - 03/26/24 1019     Visit Number 6    Number of Visits 17    Date for Recertification  05/01/24    Authorization Type [employer] [facility]    Authorization - Visit Number 6    Authorization - Number of Visits 30    Progress Note Due on Visit 10    SLP Start Time 1530    SLP Stop Time  1555    SLP Time Calculation (min) 25 min    Activity Tolerance Patient tolerated treatment well          Past Medical History:  Diagnosis Date   Dizziness and giddiness    Past Surgical History:  Procedure Laterality Date   UMBILICAL HERNIA REPAIR     Patient Active Problem List   Diagnosis Date Noted   Dizziness 02/22/2014   Numbness 02/22/2014   Neck pain 02/22/2014   Pulsatile tinnitus 02/22/2014    ONSET DATE:  5 months ago; date of referral 02/19/2024  REFERRING DIAG: 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 He notes his hoarseness is slightly better in the morning but worsens through the day. He does work in a environment where he has to raise his voice quite a bit to communicate with other workers.   Laryngoscopy revealed vocal cords are mobile but there is a small nodule on the lft mid cord. Posterior glottis reveals some erythema and pachyderma and what looks to be healing of the right arytenoid where he may have had a small ulcer. left middle true vocal fold: nodule.    PAIN:  Are you having pain? No   FALLS: Has patient fallen  in last 6 months? No  LIVING ENVIRONMENT: Lives with: lives with their family and lives with their spouse Lives in: House/apartment  PLOF: Independent  PATIENT GOALS    To improve voice  SUBJECTIVE STATEMENT: Pt eager, good historian Pt accompanied by: self  OBJECTIVE:   TODAY'S TREATMENT:  Skilled treatment session targeted pt's dysphonia goals. SLP facilitated session by providing the following interventions:  Pt reports having to work over the weekend and asks if current change in weather could affect his dysphonia. He present with mild increase in hoarseness today. He also reports noticing a change in his voice if the outside temperature is up and down throughout the time (change in season).  Despite this, he reports I feel like I am headed in the right direction (with his voice.   Pt continues to be Mod I with use of phono-safe strategies as well as strategies to promote easy onset and reduce vocal strain  PATIENT EDUCATION: Education details: see above Person educated: Patient Education method: Explanation Education comprehension: needs further education   HOME EXERCISE PROGRAM: As above    GOALS: Goals reviewed with patient? Yes  SHORT TERM GOALS: Target date: 10 sessions  The patient will maximize voice quality and loudness using breath support/oral resonance for sustained vowel production, pitch glides,  and hierarchal speech drill.  Baseline: Goal status: INITIAL  2.  The patient will increase hydration for an eventual goal of 6-8 glasses per day and limit caffeine intake (to maximum of 1-2, 8 oz cups/day), as measured by patient report.  Baseline:  Goal status: INITIAL   LONG TERM GOALS: Target date: 05/01/2024  The patient will demonstrate independent understanding of vocal hygiene concepts.  Baseline:  Goal status: INITIAL  2.  Patient will report improved communication effectiveness as measured by PROM Baseline:  Goal status: INITIAL  3.  Pt  will identify healthy voice alternatives and ways to promote vocal health in 80% of opportunities across 3 data collections.  Baseline:  Goal status: INITIAL   ASSESSMENT:  CLINICAL IMPRESSION: Patient is a 53 y.o. 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   Pt presents with improving dysphonia that is c/b hoarse, breathy, raspy, rough vocal quality that is lower in pitch with diplophonia present.    Pt responded with eagerness to the above therapy activities. See the above treatment note for details.   OBJECTIVE IMPAIRMENTS include voice disorder. These impairments are limiting patient from effectively communicating at home and in community. Factors affecting potential to achieve goals and functional outcome are loud work environment. Patient will benefit from skilled SLP services to address above impairments and improve overall function.  REHAB POTENTIAL: Good  PLAN: SLP FREQUENCY: 1-2x/week  SLP DURATION: 8 weeks  PLANNED INTERVENTIONS: SLP instruction and feedback and Patient/family education     Kaylianna Detert B. Rubbie, M.S., CCC-SLP, Tree surgeon Certified Brain Injury Specialist [facility]  [facility] Rehabilitation Services Office [phone] Ascom [phone] Fax [phone]

## 2024-03-27 ENCOUNTER — Ambulatory Visit: Admitting: Speech Pathology

## 2024-04-01 ENCOUNTER — Ambulatory Visit: Attending: Otolaryngology | Admitting: Speech Pathology

## 2024-04-01 DIAGNOSIS — R490 Dysphonia: Secondary | ICD-10-CM | POA: Insufficient documentation

## 2024-04-01 NOTE — Therapy (Signed)
 OUTPATIENT SPEECH LANGUAGE PATHOLOGY  VOICE TREATMENT   Patient Name: Jeremiah Bailey MRN: [id number] DOB:01-14-1971, 53 y.o., male Today's Date: 04/01/2024  PCP: Hugh Charleston, MD REFERRING PROVIDER: Blair Mt, MD   End of Session - 04/01/24 1528     Visit Number 7    Number of Visits 17    Date for Recertification  05/01/24    Authorization Type [employer] [facility]    Authorization - Visit Number 7    Authorization - Number of Visits 30    Progress Note Due on Visit 10    SLP Start Time 1525    SLP Stop Time  1545    SLP Time Calculation (min) 20 min    Activity Tolerance Patient tolerated treatment well          Past Medical History:  Diagnosis Date   Dizziness and giddiness    Past Surgical History:  Procedure Laterality Date   UMBILICAL HERNIA REPAIR     Patient Active Problem List   Diagnosis Date Noted   Dizziness 02/22/2014   Numbness 02/22/2014   Neck pain 02/22/2014   Pulsatile tinnitus 02/22/2014    ONSET DATE:  5 months ago; date of referral 02/19/2024  REFERRING DIAG: 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 He notes his hoarseness is slightly better in the morning but worsens through the day. He does work in a environment where he has to raise his voice quite a bit to communicate with other workers.   Laryngoscopy revealed vocal cords are mobile but there is a small nodule on the lft mid cord. Posterior glottis reveals some erythema and pachyderma and what looks to be healing of the right arytenoid where he may have had a small ulcer. left middle true vocal fold: nodule.    PAIN:  Are you having pain? No   FALLS: Has patient fallen  in last 6 months? No  LIVING ENVIRONMENT: Lives with: lives with their family and lives with their spouse Lives in: House/apartment  PLOF: Independent  PATIENT GOALS    To improve voice  SUBJECTIVE STATEMENT: Pt eager, good historian Pt accompanied by: self  OBJECTIVE:   TODAY'S TREATMENT:  Skilled treatment session targeted pt's dysphonia goals. SLP facilitated session by providing the following interventions:  Pt continues to be Mod I with vocal strategies and techniques. Despite having to work 6 days this week his dysphonia continues to improve. Pt also states it seems like it is getting back to normal, like it use to be. It (his voice) fluctuates day to day and is also based on how ever much I use it or if I have to strain it.  Pt continues to be Mod I with use of phono-safe strategies as well as strategies to promote easy onset and reduce vocal strain  PATIENT EDUCATION: Education details: see above Person educated: Patient Education method: Explanation Education comprehension: needs further education   HOME EXERCISE PROGRAM: As above    GOALS: Goals reviewed with patient? Yes  SHORT TERM GOALS: Target date: 10 sessions  The patient will maximize voice quality and loudness using breath support/oral resonance for sustained vowel production, pitch glides, and hierarchal speech  drill.  Baseline: Goal status: INITIAL  2.  The patient will increase hydration for an eventual goal of 6-8 glasses per day and limit caffeine intake (to maximum of 1-2, 8 oz cups/day), as measured by patient report.  Baseline:  Goal status: INITIAL   LONG TERM GOALS: Target date: 05/01/2024  The patient will demonstrate independent understanding of vocal hygiene concepts.  Baseline:  Goal status: INITIAL  2.  Patient will report improved communication effectiveness as measured by PROM Baseline:  Goal status: INITIAL  3.  Pt will identify healthy voice alternatives and ways to  promote vocal health in 80% of opportunities across 3 data collections.  Baseline:  Goal status: INITIAL   ASSESSMENT:  CLINICAL IMPRESSION: Patient is a 53 y.o. 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   Pt presents with improving dysphonia that is c/b hoarse, breathy, raspy, rough vocal quality that is lower in pitch with diplophonia present.    Pt responded with eagerness to the above therapy activities. Given continued independence with therapy activities and improved vocal quality will see pt in 2 weeks. See the above treatment note for details.   OBJECTIVE IMPAIRMENTS include voice disorder. These impairments are limiting patient from effectively communicating at home and in community. Factors affecting potential to achieve goals and functional outcome are loud work environment. Patient will benefit from skilled SLP services to address above impairments and improve overall function.  REHAB POTENTIAL: Good  PLAN: SLP FREQUENCY: 1-2x/week  SLP DURATION: 8 weeks  PLANNED INTERVENTIONS: SLP instruction and feedback and Patient/family education     Alita Waldren B. Rubbie, M.S., CCC-SLP, Tree surgeon Certified Brain Injury Specialist [facility]  [facility] Rehabilitation Services Office [phone] Ascom [phone] Fax [phone]

## 2024-04-02 ENCOUNTER — Encounter: Admitting: Speech Pathology

## 2024-04-03 ENCOUNTER — Ambulatory Visit: Admitting: Speech Pathology

## 2024-04-09 ENCOUNTER — Ambulatory Visit: Admitting: Speech Pathology

## 2024-04-11 ENCOUNTER — Ambulatory Visit: Admitting: Speech Pathology

## 2024-04-15 ENCOUNTER — Ambulatory Visit: Admitting: Speech Pathology

## 2024-04-15 DIAGNOSIS — R490 Dysphonia: Secondary | ICD-10-CM

## 2024-04-15 NOTE — Therapy (Signed)
 OUTPATIENT SPEECH LANGUAGE PATHOLOGY  VOICE TREATMENT   Patient Name: Jeremiah Bailey MRN: [id number] DOB:16-Oct-1970, 53 y.o., male Today's Date: 04/15/2024  PCP: Hugh Charleston, MD REFERRING PROVIDER: Blair Mt, MD   End of Session - 04/15/24 1544     Visit Number 8    Number of Visits 17    Date for Recertification  05/01/24    Authorization Type [employer] [facility]    Authorization - Visit Number 8    Authorization - Number of Visits 30    Progress Note Due on Visit 10    SLP Start Time 1530    SLP Stop Time  1548    SLP Time Calculation (min) 18 min    Activity Tolerance Patient tolerated treatment well          Past Medical History:  Diagnosis Date   Dizziness and giddiness    Past Surgical History:  Procedure Laterality Date   UMBILICAL HERNIA REPAIR     Patient Active Problem List   Diagnosis Date Noted   Dizziness 02/22/2014   Numbness 02/22/2014   Neck pain 02/22/2014   Pulsatile tinnitus 02/22/2014    ONSET DATE:  5 months ago; date of referral 02/19/2024  REFERRING DIAG: 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 He notes his hoarseness is slightly better in the morning but worsens through the day. He does work in a environment where he has to raise his voice quite a bit to communicate with other workers.   Laryngoscopy revealed vocal cords are mobile but there is a small nodule on the lft mid cord. Posterior glottis reveals some erythema and pachyderma and what looks to be healing of the right arytenoid where he may have had a small ulcer. left middle true vocal fold: nodule.    PAIN:  Are you having pain? No   FALLS: Has patient fallen  in last 6 months? No  LIVING ENVIRONMENT: Lives with: lives with their family and lives with their spouse Lives in: House/apartment  PLOF: Independent  PATIENT GOALS    To improve voice  SUBJECTIVE STATEMENT: Pt eager, good historian Pt accompanied by: self  OBJECTIVE:   TODAY'S TREATMENT:  Skilled treatment session targeted pt's dysphonia goals. SLP facilitated session by providing the following interventions:  Pt continues to be Mod I with vocal strategies and techniques and presents with improved dysphonia that is c/b as less hoarse/less breathy with increased phonation heard.   Pt continues to be Mod I with use of phono-safe strategies as well as strategies to promote easy onset and reduce vocal strain  PATIENT EDUCATION: Education details: see above Person educated: Patient Education method: Explanation Education comprehension: needs further education   HOME EXERCISE PROGRAM: As above    GOALS: Goals reviewed with patient? Yes  SHORT TERM GOALS: Target date: 10 sessions  The patient will maximize voice quality and loudness using breath support/oral resonance for sustained vowel production, pitch glides, and hierarchal speech drill.  Baseline: Goal status: INITIAL  2.  The patient will increase hydration for an eventual goal of 6-8 glasses per day and limit caffeine intake (to maximum of 1-2, 8 oz cups/day), as measured by patient  report.  Baseline:  Goal status: INITIAL   LONG TERM GOALS: Target date: 05/01/2024  The patient will demonstrate independent understanding of vocal hygiene concepts.  Baseline:  Goal status: INITIAL  2.  Patient will report improved communication effectiveness as measured by PROM Baseline:  Goal status: INITIAL  3.  Pt will identify healthy voice alternatives and ways to promote vocal health in 80% of opportunities across 3 data collections.  Baseline:  Goal status: INITIAL   ASSESSMENT:  CLINICAL IMPRESSION: Patient  is a 53 y.o. 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   Pt presents for 2 weeks follow up. He reports that he feels like I am making progress in the right direction. He continues to implement vocal warm up, decreased talking over the weekend and using smaller distances when talking to people at work to reduce vocal strain of talking over environmental noise. At this time, recommend placing pt on hold pending follow up ENT appt for re-evaluation of nodule. Pt is in agreement.   OBJECTIVE IMPAIRMENTS include voice disorder. These impairments are limiting patient from effectively communicating at home and in community. Factors affecting potential to achieve goals and functional outcome are loud work environment. Patient will benefit from skilled SLP services to address above impairments and improve overall function.  REHAB POTENTIAL: Good  PLAN: SLP FREQUENCY: 1-2x/week  SLP DURATION: 8 weeks  PLANNED INTERVENTIONS: SLP instruction and feedback and Patient/family education     Traylon Schimming B. Rubbie, M.S., CCC-SLP, Tree surgeon Certified Brain Injury Specialist [facility]  [facility] Rehabilitation Services Office [phone] Ascom [phone] Fax [phone]

## 2024-04-16 ENCOUNTER — Encounter: Admitting: Speech Pathology

## 2024-04-18 ENCOUNTER — Ambulatory Visit: Admitting: Speech Pathology

## 2024-04-23 ENCOUNTER — Ambulatory Visit: Admitting: Speech Pathology

## 2024-04-25 ENCOUNTER — Ambulatory Visit: Admitting: Speech Pathology

## 2024-04-30 ENCOUNTER — Encounter: Admitting: Speech Pathology

## 2024-05-02 ENCOUNTER — Encounter: Admitting: Speech Pathology

## 2024-05-07 ENCOUNTER — Encounter: Admitting: Speech Pathology

## 2024-05-09 ENCOUNTER — Encounter: Admitting: Speech Pathology

## 2024-05-13 DIAGNOSIS — K219 Gastro-esophageal reflux disease without esophagitis: Secondary | ICD-10-CM | POA: Diagnosis not present

## 2024-05-13 DIAGNOSIS — J382 Nodules of vocal cords: Secondary | ICD-10-CM | POA: Diagnosis not present

## 2024-05-14 ENCOUNTER — Encounter: Admitting: Speech Pathology

## 2024-05-16 ENCOUNTER — Encounter: Admitting: Speech Pathology

## 2024-05-20 ENCOUNTER — Encounter: Admitting: Speech Pathology

## 2024-05-21 ENCOUNTER — Encounter: Admitting: Speech Pathology

## 2024-05-22 ENCOUNTER — Encounter: Admitting: Speech Pathology

## 2024-05-28 ENCOUNTER — Encounter: Admitting: Speech Pathology

## 2024-05-30 ENCOUNTER — Encounter: Admitting: Speech Pathology

## 2024-06-04 ENCOUNTER — Encounter: Admitting: Speech Pathology

## 2024-06-04 DIAGNOSIS — Z0189 Encounter for other specified special examinations: Secondary | ICD-10-CM | POA: Diagnosis not present

## 2024-06-04 DIAGNOSIS — Z1211 Encounter for screening for malignant neoplasm of colon: Secondary | ICD-10-CM | POA: Diagnosis not present

## 2024-06-04 DIAGNOSIS — K219 Gastro-esophageal reflux disease without esophagitis: Secondary | ICD-10-CM | POA: Diagnosis not present

## 2024-06-04 DIAGNOSIS — F419 Anxiety disorder, unspecified: Secondary | ICD-10-CM | POA: Diagnosis not present

## 2024-06-04 DIAGNOSIS — Z13220 Encounter for screening for lipoid disorders: Secondary | ICD-10-CM | POA: Diagnosis not present

## 2024-06-04 DIAGNOSIS — Z125 Encounter for screening for malignant neoplasm of prostate: Secondary | ICD-10-CM | POA: Diagnosis not present

## 2024-06-06 ENCOUNTER — Encounter: Admitting: Speech Pathology

## 2024-06-12 DIAGNOSIS — Z1211 Encounter for screening for malignant neoplasm of colon: Secondary | ICD-10-CM | POA: Diagnosis not present

## 2024-07-09 ENCOUNTER — Other Ambulatory Visit: Payer: Self-pay | Admitting: Otolaryngology

## 2024-09-03 ENCOUNTER — Ambulatory Visit: Admit: 2024-09-03 | Admitting: Otolaryngology

## 2024-09-03 SURGERY — LARYNGOSCOPY, DIRECT
Anesthesia: General | Laterality: Left
